# Patient Record
Sex: Female | Born: 1935
Health system: Southern US, Community
[De-identification: ages and names within clinical notes are randomized; demographics above are authoritative.]

---

## 2001-03-09 ENCOUNTER — Ambulatory Visit (HOSPITAL_COMMUNITY): Admission: RE | Admit: 2001-03-09 | Discharge: 2001-03-10 | Payer: Self-pay | Admitting: Ophthalmology

## 2001-03-09 ENCOUNTER — Encounter: Payer: Self-pay | Admitting: Ophthalmology

## 2001-07-13 ENCOUNTER — Encounter: Payer: Self-pay | Admitting: Vascular Surgery

## 2001-07-16 ENCOUNTER — Encounter (INDEPENDENT_AMBULATORY_CARE_PROVIDER_SITE_OTHER): Payer: Self-pay | Admitting: *Deleted

## 2001-07-16 ENCOUNTER — Inpatient Hospital Stay (HOSPITAL_COMMUNITY): Admission: RE | Admit: 2001-07-16 | Discharge: 2001-07-17 | Payer: Self-pay | Admitting: Vascular Surgery

## 2005-01-28 ENCOUNTER — Ambulatory Visit: Payer: Self-pay | Admitting: Cardiology

## 2007-03-08 ENCOUNTER — Ambulatory Visit: Payer: Self-pay | Admitting: Cardiology

## 2009-11-13 ENCOUNTER — Inpatient Hospital Stay (HOSPITAL_COMMUNITY): Admission: RE | Admit: 2009-11-13 | Discharge: 2009-11-16 | Payer: Self-pay | Admitting: Orthopedic Surgery

## 2010-06-21 LAB — COMPREHENSIVE METABOLIC PANEL
AST: 24 U/L (ref 0–37)
BUN: 14 mg/dL (ref 6–23)
CO2: 29 mEq/L (ref 19–32)
Chloride: 104 mEq/L (ref 96–112)
Creatinine, Ser: 1.02 mg/dL (ref 0.4–1.2)
GFR calc Af Amer: >60 mL/min (ref >60)
GFR calc non Af Amer: 52 mL/min — ABNORMAL LOW (ref >60)
Total Bilirubin: 0.7 mg/dL (ref 0.3–1.2)

## 2010-06-21 LAB — CBC
HCT: 36.4 % (ref 36.0–46.0)
Hemoglobin: 12 g/dL (ref 12.0–15.0)
MCH: 28.6 pg (ref 26.0–34.0)
MCHC: 33 g/dL (ref 30.0–36.0)
MCV: 86.7 fL (ref 78.0–100.0)
Platelets: 259 10*3/uL (ref 150–400)
RBC: 4.2 MIL/uL (ref 3.87–5.11)
RDW: 13.6 % (ref 11.5–15.5)
WBC: 5.5 10*3/uL (ref 4.0–10.5)

## 2010-06-21 LAB — DIFFERENTIAL
Basophils Absolute: 0 10*3/uL (ref 0.0–0.1)
Basophils Relative: 1 % (ref 0–1)
Eosinophils Relative: 1 % (ref 0–5)
Lymphocytes Relative: 24 % (ref 12–46)

## 2010-06-21 LAB — URINALYSIS, ROUTINE W REFLEX MICROSCOPIC
Bilirubin Urine: NEGATIVE
Glucose, UA: NEGATIVE mg/dL
Ketones, ur: NEGATIVE mg/dL
Leukocytes, UA: NEGATIVE
Protein, ur: NEGATIVE mg/dL

## 2010-06-21 LAB — PROTIME-INR: Prothrombin Time: 14.2 seconds (ref 11.6–15.2)

## 2010-06-21 LAB — SURGICAL PCR SCREEN: MRSA, PCR: NEGATIVE

## 2010-08-23 NOTE — Op Note (Signed)
[employer]. [facility]  Patient:    Brianna Ford, Brianna Ford Visit Number: [id number] MRN: [id number]          Service Type: SUR Location: RCRM [id number] Attending Physician:  Alyson Locket Dictated by:   Larina Earthly, M.D. Proc. Date: 07/16/01 Admit Date:  07/16/2001   CC:         Dr. Maryan Puls, [address], [place], [place]  [address]   Operative Report  PREOPERATIVE DIAGNOSIS:  Asymptomatic severe right internal carotid artery stenosis.  POSTOPERATIVE DIAGNOSIS:  Asymptomatic severe right internal carotid artery stenosis.  PROCEDURE:  Right carotid endarterectomy with Dacron patch angioplasty.  SURGEON:  Larina Earthly, M.D.  ASSISTANT:  Tollie Pizza. Collins, P.A.-C.  ANESTHESIA:  General endotracheal.  COMPLICATIONS:  None.  DISPOSITION:  To recovery room stable.  PROCEDURE IN DETAIL:  The patient was taken to the operating room, placed in position, where the area of the right neck was prepped and draped in usual sterile fashion.  An incision was made in the anterior sternocleidomastoid and carried down through the platysma with electrocautery.  The sternocleidomastoid was reflected posteriorly and the carotid sheath was opened.  The facial vein was ligated with 2-0 silk ties and divided.  The common carotid artery was encircled with an umbilical tape and Rumel tourniquet.  The vagus nerve was identified and preserved.  Dissection continued on to the bifurcation.  The superior thyroid artery was encircled with a 2-0 silk Potts tie.  The external carotid was encircled with a blue vessel loop and the internal carotid was encircled with an umbilical tape and Rumel tourniquet.  The patient was given 7000 units of intravenous heparin. After adequate circulation time, the internal and external common carotid arteries were occluded.  The common carotid artery was opened with an 11-blade and extended longitudinally with Potts scissors to the plaque on to  the internal carotid artery.  The patient had a high-grade severe stenosis in the internal carotid artery.  A #10 shunt was passed up the internal carotid, allowed to backbleed, and then down the common carotid, where it was secured with Rumel tourniquets.  The endarterectomy was begun on the common carotid artery and the plaque was divided proximally with Potts scissors.  The endarterectomy was continued on to the bifurcation.  The external carotid was endarterectomized with an eversion technique.  The internal carotid was then endarterectomized in an open fashion.  The remaining atheromatous debris was removed from the endarterectomy plane.  Finesse Hemashield Dacron patch was brought onto the field and was sewn as a patch angioplasty with a running 6-0 Prolene suture.  Prior to completion of the anastomosis, the shunt was removed and usual flush maneuvers were undertaken.  The anastomosis was then completed and then the external, followed by the common, finally the internal carotid artery occlusion clamps were removed.  Excellent flow characteristics were noted with hand-held Doppler in the internal and external carotid arteries. The patient was given 50 mg of protamine to reverse the heparin.  The wound was irrigated with saline.  Hemostasis with electrocautery.  Wound was closed with 3-0 Vicryl in the subcutaneous and subcuticular tissue.  Benzoin and Steri-Strips were applied. Dictated by:   Larina Earthly, M.D. Attending Physician:  Alyson Locket DD:  07/16/01 TD:  07/17/01 Job: [id number]

## 2010-08-23 NOTE — H&P (Signed)
[place]. [facility]  Patient:    Brianna Ford, Brianna Ford Visit Number: [id number] MRN: [id number]          Service Type: SUR Location: [id number] Attending Physician:  Alyson Locket Dictated by:   Adair Patter, P.A. Admit Date:  07/16/2001 Discharge Date: 07/17/2001                           History and Physical  CHIEF COMPLAINT:  Carotid artery disease.  HISTORY OF PRESENT ILLNESS:  This is a 77 year old white female who was referred to Dr. Kristen Loader. Early by Dr. Clelia Croft for evaluation of carotid artery disease.  The patient says that during preop evaluation for cataract surgery, her ophthalmologist thought he had a carotid bruit in her neck.  Because of this, she was referred to her medical doctor following her cataract surgery. The patient was found to have a bruit on her right side and was subsequently referred for bilateral carotid Duplex, which revealed she had significant right internal carotid artery stenosis.  The patient denies any headache, nausea, vomiting, vertigo, dizziness, history of falls, seizures, numbness, tingling, muscle weakness, speech impairment or dysphagia.  She denies any visual change, syncope, presyncope, memory loss or confusion.  PAST MEDICAL HISTORY: 1. Hypertension. 2. Cataract.  PAST SURGICAL HISTORY:  Cataract removal from right eye.  ALLERGIES:  The patient denies any known medication allergies.  MEDICATIONS: 1. Lisinopril 20 mg p.o. q.d. 2. Aspirin 81 mg p.o. q.d. 3. Centrum multivitamin one daily.  SOCIAL HISTORY:  Th patient denies any alcohol use or history of smoking.  She is married and has one child.  FAMILY HISTORY:  The patient says that she has 63 sisters and 2 brothers. Several of her family members have hypertension and she had one sister with ovarian cancer.  There is no diabetes mellitus in her family.  She states her father had a cerebrovascular accident and had hypertension.  REVIEW OF SYSTEMS:   GENERAL:  The patient denies any fever, chills, night sweats or frequent illnesses.  HEAD:  The patient denies any head injuries or loss of consciousness.  EYES:  The patient had a cataract in her right eye which was treated surgically.  She wears corrective lenses.  She denies any glaucoma.  EARS:  The patient denies any hearing loss, vertigo, tinnitus or ear infections.  NOSE:  The patient denies any epistaxis, rhinitis or sinusitis.  MOUTH:  The patient denies any problems with dentition or frequent sore throats.  NECK:  The patient denies any lumps, masses or pain on range of motion in her neck.  CARDIOVASCULAR:  The patient has hypertension which is treated medically.  Denies any history of angina, myocardial infarction or cardiac arrhythmias.  PULMONARY:  The patient denies any asthma, bronchitis, emphysema or pneumonia.  GI:  The patient denies any nausea, vomiting, diarrhea, constipation, hematochezia or melena.  ENDOCRINE:  The patient denies any diabetes mellitus or thyroid disease.  GU:  The patient denies any renal disease or urinary tract infections or urinary incontinence. MUSCULOSKELETAL:  The patient denies any arthritis, arthralgias or myalgias. NEUROLOGIC:  The patient denies any memory loss, depression, CVA or TIA.  PHYSICAL EXAMINATION:  VITALS:  Blood pressure is 145/85.  Pulse is 84 and regular.  Respirations are 16.  GENERAL:  The patient is alert and oriented x3, is in no distress.  HEENT:  Head is atraumatic, normocephalic.  Eyes:  Pupils are equal,  round and reactive to light.  Extraocular movements are intact without scleral icterus or nystagmus.  Ears:  Auditory acuity is grossly intact.  Nose:  Nasal patency is intact.  Sinuses are nontender.  Mouth is moist without exudates.  NECK:  Supple without JVD, lymphadenopathy or thyromegaly.  She had a carotid bruit present on the right-hand side.  No bruits are present on the left side.  CARDIOVASCULAR:   Regular rate and rhythm without murmurs, gallops or rubs.  LUNGS:  Bilaterally clear to auscultation without rales rhonchi or wheezes.  ABDOMEN:  Soft, nontender and nondistended.  Positive bowel sounds in all four quadrants.  EXTREMITIES:  No cyanosis, clubbing or edema.  NEUROLOGIC:  Cranial nerves II-XII are grossly intact.  Her gait was steady without the use of assistive devices.  Muscle strength was 5+ and equal in all four extremities.  IMPRESSION:  Right internal carotid artery stenosis.  PLAN:  Dr. Arbie Cookey will perform a right carotid endarterectomy on July 16, 2001. Dictated by:   Adair Patter, P.A. Attending Physician:  Alyson Locket DD:  07/13/01 TD:  07/13/01 Job: [id number]

## 2010-08-23 NOTE — Discharge Summary (Signed)
[place]. [facility]  Patient:    Brianna Ford, Brianna Ford Visit Number: [id number] MRN: [id number]          Service Type: SUR Location: [id number] Attending Physician:  Alyson Locket Dictated by:   Loura Pardon, P.A. Admit Date:  07/16/2001 Disc. Date: 07/17/01   CC:         Kirstie Peri, M.D., [place], [facility] D. Ashley Royalty, M.D.   Discharge Summary  DATE OF BIRTH:  1922-06-24  DISCHARGE DIAGNOSIS:  Asymptomatic high-grade right internal carotid artery stenosis.  SECONDARY DIAGNOSES: 1. Hypertension. 2. History of cataracts.  PROCEDURES:  July 16, 2001, right carotid endarterectomy with Dacron patch angioplasty.  Dr. Tawanna Cooler Early, surgeon.  The patient was transferred in stable and satisfactory condition to the recovery room where she awoke with no neurologic deficits.  Her speech was clear, mental status clear, moving both upper and lower extremities appropriately.  DISCHARGE DISPOSITION:   Ms. Brianna Ford is ready for discharge on postoperative day #1.  She did have some mild hypertension the evening of her surgery.  This was controlled with IV nitroglycerin for a 3-hour period and then weaned.  On the morning of postoperative day #1, she was normotensive. She was afebrile.  She was without oxygen supplementation and had oxygen saturation 99% on room air.  She did have some transient nausea after ambulation.  She was given Phenergan for this and had resolution.  Her postoperative labs looked good.  Hematocrit 31.8%, hemoglobin 10.8. Creatinine 0.6.  She was moving both upper and lower extremities appropriately.  Mental status is clear.  The incision looks good.  There is no evidence of swelling, erythema, or drainage.  She is ambulating independently. Her pain is controlled with Tylox.  DISCHARGE MEDICATIONS: 1. Tylox 1 to 2 tablets p.o. q.4-6h. p.r.n. pain. 2. Enteric-coated aspirin 81 mg daily. 3. Lisinopril 20 mg daily. 4.  Xanax 0.25 mg 1/2 tablet as needed for anxiety.  DISCHARGE ACTIVITY:  She is asked to walk to keep her strength.  She is to avoid heavy lifting or strenuous activity in the three to four days after surgery.  DISCHARGE DIET:  Low-sodium, low-cholesterol diet.  WOUND CARE:  She may shower daily beginning Sunday, April 13.  FOLLOWUP:  She will see Dr. Arbie Cookey on Thursday, May 8, at 8:50 in the morning.  BRIEF HISTORY:  Ms. Brianna Ford is a 77 year old active female found to have a carotid bruit on physical examination.  She underwent carotid duplex ultrasound at [facility].  This study demonstrated a high-grade right internal carotid artery stenosis.  She was referred to Dr. Tawanna Cooler Early for further evaluation.  She underwent repeat carotid duplex ultrasound in the office of [facility] on May 06, 2001. She has a severe, greater than 80% stenosis of the right internal carotid artery at the area of the bifurcation.  The left internal carotid artery is without significant stenosis.  The risks and benefits of surgery for prevention of stroke were discussed with Ms. Goodness, and she wishes to proceed with the surgery.  This was scheduled for July 16, 2001.  HOSPITAL COURSE:  As described in Discharge Disposition.  Brianna Ford was discharged from [facility] on postoperative day #1, April 12. Dictated by:   Loura Pardon, P.A. Attending Physician:  Alyson Locket DD:  07/17/01 TD:  07/18/01 Job: [id number]

## 2010-08-23 NOTE — Op Note (Signed)
[place]. [facility]  Patient:    Brianna Ford, Brianna Ford Visit Number: [id number] MRN: [id number]          Service Type: DSU Location: [phone] Attending Physician:  Bertrum Sol Dictated by: Beulah Gandy. Ashley Royalty, M.D. Proc. Date: 03/09/01 Admit Date: 03/09/2001                             Operative Report  DATE OF BIRTH:  1923/01/26.  PREOPERATIVE DIAGNOSIS:  Retained lens material and retained lens material debris in anterior chamber.  PROCEDURES: 1. Pars plana vitrectomy with retinal photocoagulation. 2. Iridectomy. 3. Anterior chamber wash-out.  SURGEON:  Beulah Gandy. Ashley Royalty, M.D.  ASSISTANT:  Lu Duffel, C.O.A., S.A.  ANESTHESIA:  General.  DESCRIPTION OF PROCEDURE:  Usual prep and drape.  Peritomies at 8, 10, and 2 oclock.  A 4 mm angled infusion port anchored into place at 8 oclock.  The lighted pick and the cutter were placed at 10 and 2 oclock, respectively. Lens material was removed from around the intraocular lens.  Scleral depression was usedto bring the peripheral retina into place and remove the vitreous and lens material from this area.  Once this was accomplished, a peripheral iridectomy was performed at 12 oclock.  Additional lens fragments were seen in the posterior segment of the eye, and they were removed with the wide-field lens, flat lens, and the 30 degree prismatic lens for viewing. Once the entire vitreous cavity was totally cleansed, attention was carried to the anterior chamber, where a 25-gauge needle was inserted through the 11 oclock limbus and debris with lens cortex and some blood was removed from the inferior angle.  The needle was removed, and the wound was leaking; therefore, a 10-0 nylon was used to close the sclerotomy site.  Nylon 9-0 was used to close the other sclerotomy sites.  The instruments were removed from the eye.  The conjunctiva was closed with wet-field cautery.  Polymyxin  and gentamicin were irrigated into Tenons space, atropine solutionwas applied. Decadron 10 mg was injected into the lower subconjunctivalspace.  The indirect ophthalmoscope laser was then moved into place and 421 burns were placed around the retinal periphery in two rows.  The power was 400 milliwatts, 1000 microns each, and 0.1 seconds each.  Conjunctiva was closed with wet-field cautery.  The closing tension was 10 with a Barraquer tonometer.  Polysporin, a patch, and shield were placed.  The patient was awakened and taken to recovery in satisfactory condition. Dictated by:   Beulah Gandy. Ashley Royalty, M.D. Attending Physician: Bertrum Sol DD:  03/09/52 TD:  03/10/01 Job: [id number]

## 2010-11-04 ENCOUNTER — Encounter (INDEPENDENT_AMBULATORY_CARE_PROVIDER_SITE_OTHER): Payer: Self-pay | Admitting: Ophthalmology

## 2010-11-19 ENCOUNTER — Encounter (INDEPENDENT_AMBULATORY_CARE_PROVIDER_SITE_OTHER): Payer: Medicare Other | Admitting: Ophthalmology

## 2010-11-19 DIAGNOSIS — H3530 Unspecified macular degeneration: Secondary | ICD-10-CM

## 2010-11-19 DIAGNOSIS — H35359 Cystoid macular degeneration, unspecified eye: Secondary | ICD-10-CM

## 2010-11-19 DIAGNOSIS — H43819 Vitreous degeneration, unspecified eye: Secondary | ICD-10-CM

## 2010-12-18 ENCOUNTER — Encounter (INDEPENDENT_AMBULATORY_CARE_PROVIDER_SITE_OTHER): Payer: Medicare Other | Admitting: Ophthalmology

## 2010-12-18 DIAGNOSIS — H3530 Unspecified macular degeneration: Secondary | ICD-10-CM

## 2010-12-18 DIAGNOSIS — H35359 Cystoid macular degeneration, unspecified eye: Secondary | ICD-10-CM

## 2010-12-18 DIAGNOSIS — H43819 Vitreous degeneration, unspecified eye: Secondary | ICD-10-CM

## 2011-01-20 ENCOUNTER — Encounter (INDEPENDENT_AMBULATORY_CARE_PROVIDER_SITE_OTHER): Payer: Medicare Other | Admitting: Ophthalmology

## 2011-01-20 DIAGNOSIS — H35359 Cystoid macular degeneration, unspecified eye: Secondary | ICD-10-CM

## 2011-01-20 DIAGNOSIS — H43819 Vitreous degeneration, unspecified eye: Secondary | ICD-10-CM

## 2011-01-20 DIAGNOSIS — H353190 Nonexudative age-related macular degeneration, unspecified eye, stage unspecified: Secondary | ICD-10-CM

## 2011-02-02 IMAGING — RF DG ANKLE COMPLETE 3+V*L*
1 series · 3 of 3 positions shown · non-contrast
Comparison: None.

CLINICAL DATA: ORIF of the left ankle for bimalleolar fracture.

LEFT ANKLE COMPLETE - 3+ VIEW

[Series 1: run · 3 of 3 slices shown]
[im 1/3]
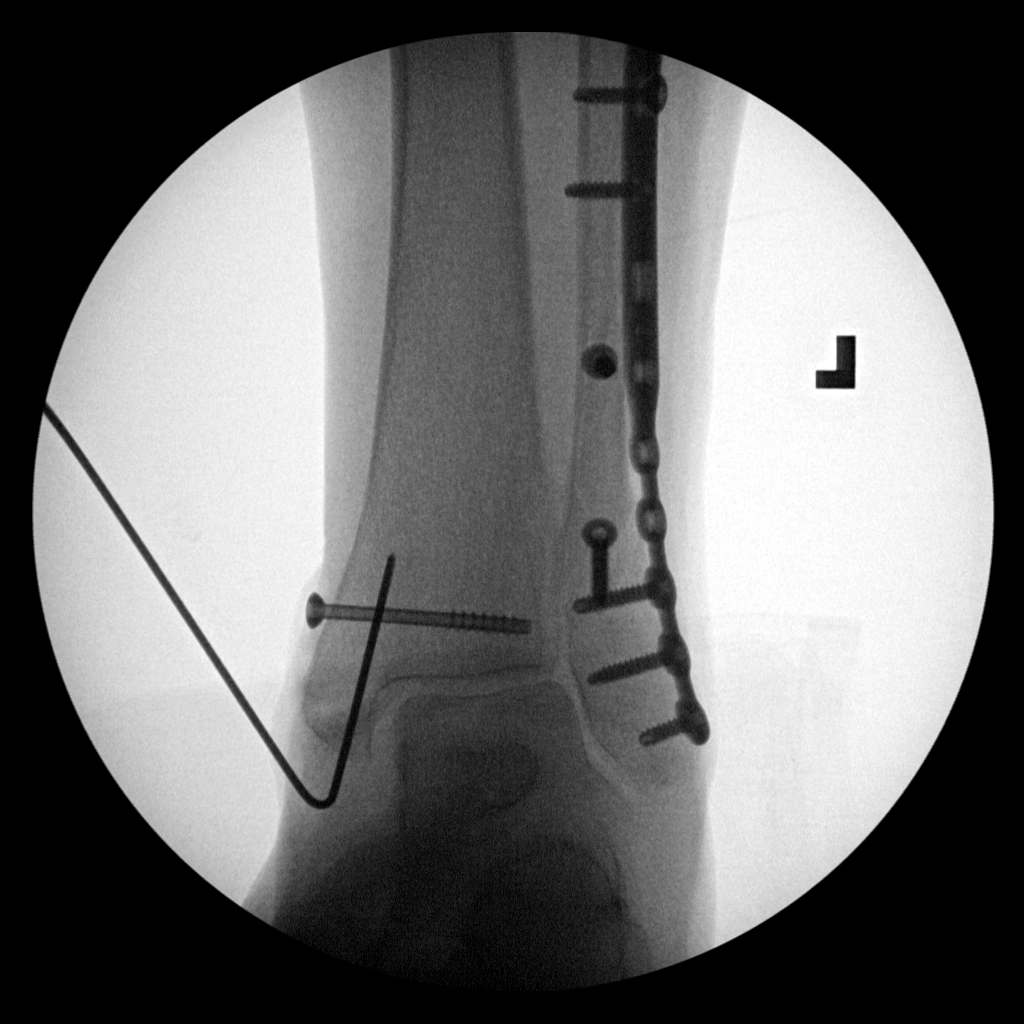
[im 2/3]
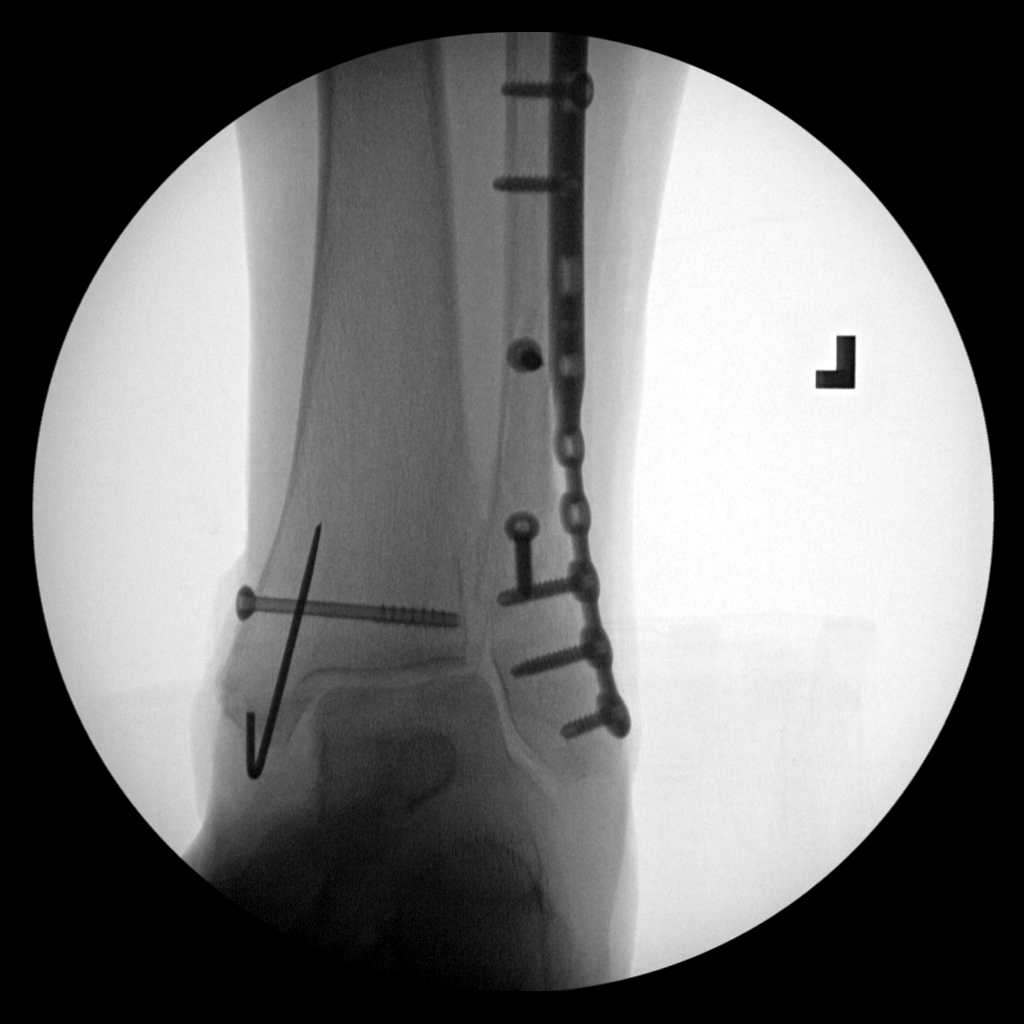
[im 3/3]
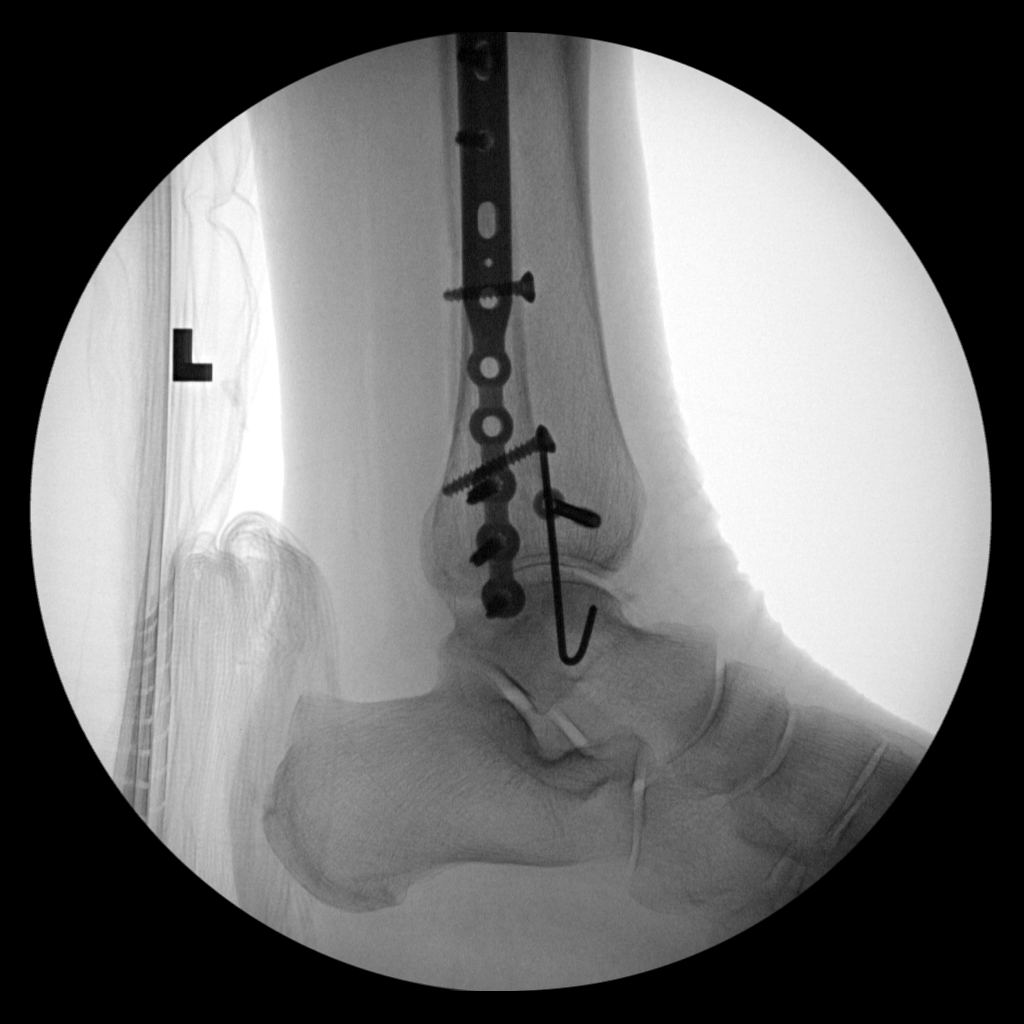

[3 of 3 positions shown; findings below may reference images not displayed]

FINDINGS: Three spot intraoperative fluoroscopic views of the left
ankle are submitted.  There are postoperative changes of ORIF of
the left ankle, with lateral cortical plate and screw fixation of
the distal fibula.  There is a single transversely oriented screw
in the distal tibia and [DATE] is seen in the medial malleolus.
Ankle mortise is aligned.  Fracture fragments.  Near anatomic
alignment.
IMPRESSION: ORIF for bimalleolar fracture.  No complicating feature identified.

## 2011-02-24 ENCOUNTER — Encounter (INDEPENDENT_AMBULATORY_CARE_PROVIDER_SITE_OTHER): Payer: Medicare Other | Admitting: Ophthalmology

## 2011-03-10 ENCOUNTER — Encounter (INDEPENDENT_AMBULATORY_CARE_PROVIDER_SITE_OTHER): Payer: Medicare Other | Admitting: Ophthalmology

## 2011-03-12 ENCOUNTER — Encounter (INDEPENDENT_AMBULATORY_CARE_PROVIDER_SITE_OTHER): Payer: Medicare Other | Admitting: Ophthalmology

## 2011-03-12 DIAGNOSIS — H43819 Vitreous degeneration, unspecified eye: Secondary | ICD-10-CM

## 2011-03-12 DIAGNOSIS — H35359 Cystoid macular degeneration, unspecified eye: Secondary | ICD-10-CM

## 2011-03-12 DIAGNOSIS — H3530 Unspecified macular degeneration: Secondary | ICD-10-CM

## 2011-04-16 ENCOUNTER — Encounter (INDEPENDENT_AMBULATORY_CARE_PROVIDER_SITE_OTHER): Payer: Medicare Other | Admitting: Ophthalmology

## 2012-05-31 ENCOUNTER — Encounter (INDEPENDENT_AMBULATORY_CARE_PROVIDER_SITE_OTHER): Payer: Medicare Other | Admitting: Ophthalmology

## 2012-05-31 DIAGNOSIS — H43819 Vitreous degeneration, unspecified eye: Secondary | ICD-10-CM

## 2012-05-31 DIAGNOSIS — H35359 Cystoid macular degeneration, unspecified eye: Secondary | ICD-10-CM

## 2012-05-31 DIAGNOSIS — H3530 Unspecified macular degeneration: Secondary | ICD-10-CM

## 2012-06-28 ENCOUNTER — Encounter (INDEPENDENT_AMBULATORY_CARE_PROVIDER_SITE_OTHER): Payer: Medicare Other | Admitting: Ophthalmology

## 2012-07-28 ENCOUNTER — Encounter (INDEPENDENT_AMBULATORY_CARE_PROVIDER_SITE_OTHER): Payer: Medicare Other | Admitting: Ophthalmology

## 2012-07-28 DIAGNOSIS — H35359 Cystoid macular degeneration, unspecified eye: Secondary | ICD-10-CM

## 2012-07-28 DIAGNOSIS — H3530 Unspecified macular degeneration: Secondary | ICD-10-CM

## 2012-07-28 DIAGNOSIS — H43819 Vitreous degeneration, unspecified eye: Secondary | ICD-10-CM

## 2012-08-25 ENCOUNTER — Encounter (INDEPENDENT_AMBULATORY_CARE_PROVIDER_SITE_OTHER): Payer: Medicare Other | Admitting: Ophthalmology

## 2012-09-13 ENCOUNTER — Encounter (INDEPENDENT_AMBULATORY_CARE_PROVIDER_SITE_OTHER): Payer: Medicare Other | Admitting: Ophthalmology

## 2015-01-03 ENCOUNTER — Encounter (INDEPENDENT_AMBULATORY_CARE_PROVIDER_SITE_OTHER): Payer: Medicare Other | Admitting: Ophthalmology

## 2015-01-11 ENCOUNTER — Encounter (INDEPENDENT_AMBULATORY_CARE_PROVIDER_SITE_OTHER): Payer: Medicare Other | Admitting: Ophthalmology

## 2015-01-16 ENCOUNTER — Encounter (INDEPENDENT_AMBULATORY_CARE_PROVIDER_SITE_OTHER): Payer: Medicare Other | Admitting: Ophthalmology

## 2015-01-17 ENCOUNTER — Encounter (INDEPENDENT_AMBULATORY_CARE_PROVIDER_SITE_OTHER): Payer: Medicare Other | Admitting: Ophthalmology

## 2015-01-17 DIAGNOSIS — H353124 Nonexudative age-related macular degeneration, left eye, advanced atrophic with subfoveal involvement: Secondary | ICD-10-CM | POA: Diagnosis not present

## 2015-01-17 DIAGNOSIS — I10 Essential (primary) hypertension: Secondary | ICD-10-CM | POA: Diagnosis not present

## 2015-01-17 DIAGNOSIS — H59031 Cystoid macular edema following cataract surgery, right eye: Secondary | ICD-10-CM | POA: Diagnosis not present

## 2015-01-17 DIAGNOSIS — H353211 Exudative age-related macular degeneration, right eye, with active choroidal neovascularization: Secondary | ICD-10-CM | POA: Diagnosis not present

## 2015-01-17 DIAGNOSIS — H35033 Hypertensive retinopathy, bilateral: Secondary | ICD-10-CM | POA: Diagnosis not present

## 2015-01-17 DIAGNOSIS — H43813 Vitreous degeneration, bilateral: Secondary | ICD-10-CM

## 2015-04-18 DIAGNOSIS — Z418 Encounter for other procedures for purposes other than remedying health state: Secondary | ICD-10-CM | POA: Diagnosis not present

## 2015-04-18 DIAGNOSIS — J069 Acute upper respiratory infection, unspecified: Secondary | ICD-10-CM | POA: Diagnosis not present

## 2015-04-18 DIAGNOSIS — Z789 Other specified health status: Secondary | ICD-10-CM | POA: Diagnosis not present

## 2015-04-18 DIAGNOSIS — M545 Low back pain: Secondary | ICD-10-CM | POA: Diagnosis not present

## 2015-04-18 DIAGNOSIS — G8929 Other chronic pain: Secondary | ICD-10-CM | POA: Diagnosis not present

## 2015-04-18 DIAGNOSIS — Z6826 Body mass index (BMI) 26.0-26.9, adult: Secondary | ICD-10-CM | POA: Diagnosis not present

## 2015-04-27 DIAGNOSIS — H547 Unspecified visual loss: Secondary | ICD-10-CM | POA: Diagnosis not present

## 2015-04-27 DIAGNOSIS — H353122 Nonexudative age-related macular degeneration, left eye, intermediate dry stage: Secondary | ICD-10-CM | POA: Diagnosis not present

## 2015-04-27 DIAGNOSIS — H353213 Exudative age-related macular degeneration, right eye, with inactive scar: Secondary | ICD-10-CM | POA: Diagnosis not present

## 2015-06-06 DIAGNOSIS — M545 Low back pain: Secondary | ICD-10-CM | POA: Diagnosis not present

## 2015-06-06 DIAGNOSIS — G8929 Other chronic pain: Secondary | ICD-10-CM | POA: Diagnosis not present

## 2015-06-06 DIAGNOSIS — F329 Major depressive disorder, single episode, unspecified: Secondary | ICD-10-CM | POA: Diagnosis not present

## 2015-06-06 DIAGNOSIS — I10 Essential (primary) hypertension: Secondary | ICD-10-CM | POA: Diagnosis not present

## 2015-07-10 DIAGNOSIS — H01009 Unspecified blepharitis unspecified eye, unspecified eyelid: Secondary | ICD-10-CM | POA: Diagnosis not present

## 2015-07-10 DIAGNOSIS — H02059 Trichiasis without entropian unspecified eye, unspecified eyelid: Secondary | ICD-10-CM | POA: Diagnosis not present

## 2015-07-25 DIAGNOSIS — M19019 Primary osteoarthritis, unspecified shoulder: Secondary | ICD-10-CM | POA: Diagnosis not present

## 2015-07-25 DIAGNOSIS — I7300 Raynaud's syndrome without gangrene: Secondary | ICD-10-CM | POA: Diagnosis not present

## 2015-07-25 DIAGNOSIS — K219 Gastro-esophageal reflux disease without esophagitis: Secondary | ICD-10-CM | POA: Diagnosis not present

## 2015-07-25 DIAGNOSIS — F329 Major depressive disorder, single episode, unspecified: Secondary | ICD-10-CM | POA: Diagnosis not present

## 2015-08-22 DIAGNOSIS — M545 Low back pain: Secondary | ICD-10-CM | POA: Diagnosis not present

## 2015-08-22 DIAGNOSIS — M47816 Spondylosis without myelopathy or radiculopathy, lumbar region: Secondary | ICD-10-CM | POA: Diagnosis not present

## 2015-08-22 DIAGNOSIS — F329 Major depressive disorder, single episode, unspecified: Secondary | ICD-10-CM | POA: Diagnosis not present

## 2015-08-22 DIAGNOSIS — M549 Dorsalgia, unspecified: Secondary | ICD-10-CM | POA: Diagnosis not present

## 2015-08-22 DIAGNOSIS — R079 Chest pain, unspecified: Secondary | ICD-10-CM | POA: Diagnosis not present

## 2015-08-22 DIAGNOSIS — Z1389 Encounter for screening for other disorder: Secondary | ICD-10-CM | POA: Diagnosis not present

## 2015-09-18 DIAGNOSIS — F419 Anxiety disorder, unspecified: Secondary | ICD-10-CM | POA: Diagnosis not present

## 2015-09-18 DIAGNOSIS — R351 Nocturia: Secondary | ICD-10-CM | POA: Diagnosis not present

## 2015-10-08 DIAGNOSIS — H353122 Nonexudative age-related macular degeneration, left eye, intermediate dry stage: Secondary | ICD-10-CM | POA: Diagnosis not present

## 2015-10-08 DIAGNOSIS — Z961 Presence of intraocular lens: Secondary | ICD-10-CM | POA: Diagnosis not present

## 2015-10-08 DIAGNOSIS — H00029 Hordeolum internum unspecified eye, unspecified eyelid: Secondary | ICD-10-CM | POA: Diagnosis not present

## 2015-10-08 DIAGNOSIS — H02051 Trichiasis without entropian right upper eyelid: Secondary | ICD-10-CM | POA: Diagnosis not present

## 2015-10-11 DIAGNOSIS — M159 Polyosteoarthritis, unspecified: Secondary | ICD-10-CM | POA: Diagnosis not present

## 2015-10-11 DIAGNOSIS — I10 Essential (primary) hypertension: Secondary | ICD-10-CM | POA: Diagnosis not present

## 2015-11-13 DIAGNOSIS — M159 Polyosteoarthritis, unspecified: Secondary | ICD-10-CM | POA: Diagnosis not present

## 2015-11-13 DIAGNOSIS — I10 Essential (primary) hypertension: Secondary | ICD-10-CM | POA: Diagnosis not present

## 2015-12-12 DIAGNOSIS — Z1389 Encounter for screening for other disorder: Secondary | ICD-10-CM | POA: Diagnosis not present

## 2015-12-12 DIAGNOSIS — Z0000 Encounter for general adult medical examination without abnormal findings: Secondary | ICD-10-CM | POA: Diagnosis not present

## 2015-12-12 DIAGNOSIS — Z1211 Encounter for screening for malignant neoplasm of colon: Secondary | ICD-10-CM | POA: Diagnosis not present

## 2015-12-12 DIAGNOSIS — Z7189 Other specified counseling: Secondary | ICD-10-CM | POA: Diagnosis not present

## 2015-12-12 DIAGNOSIS — I10 Essential (primary) hypertension: Secondary | ICD-10-CM | POA: Diagnosis not present

## 2015-12-12 DIAGNOSIS — Z299 Encounter for prophylactic measures, unspecified: Secondary | ICD-10-CM | POA: Diagnosis not present

## 2015-12-12 DIAGNOSIS — R5383 Other fatigue: Secondary | ICD-10-CM | POA: Diagnosis not present

## 2015-12-12 DIAGNOSIS — Z79899 Other long term (current) drug therapy: Secondary | ICD-10-CM | POA: Diagnosis not present

## 2015-12-19 DIAGNOSIS — Z23 Encounter for immunization: Secondary | ICD-10-CM | POA: Diagnosis not present

## 2015-12-21 DIAGNOSIS — I10 Essential (primary) hypertension: Secondary | ICD-10-CM | POA: Diagnosis not present

## 2015-12-21 DIAGNOSIS — M159 Polyosteoarthritis, unspecified: Secondary | ICD-10-CM | POA: Diagnosis not present

## 2016-01-11 DIAGNOSIS — I10 Essential (primary) hypertension: Secondary | ICD-10-CM | POA: Diagnosis not present

## 2016-01-11 DIAGNOSIS — G4700 Insomnia, unspecified: Secondary | ICD-10-CM | POA: Diagnosis not present

## 2016-01-11 DIAGNOSIS — Z6828 Body mass index (BMI) 28.0-28.9, adult: Secondary | ICD-10-CM | POA: Diagnosis not present

## 2016-01-11 DIAGNOSIS — E039 Hypothyroidism, unspecified: Secondary | ICD-10-CM | POA: Diagnosis not present

## 2016-01-11 DIAGNOSIS — Z299 Encounter for prophylactic measures, unspecified: Secondary | ICD-10-CM | POA: Diagnosis not present

## 2016-01-18 ENCOUNTER — Ambulatory Visit (INDEPENDENT_AMBULATORY_CARE_PROVIDER_SITE_OTHER): Payer: Medicare Other | Admitting: Ophthalmology

## 2016-01-18 DIAGNOSIS — I10 Essential (primary) hypertension: Secondary | ICD-10-CM

## 2016-01-18 DIAGNOSIS — H353132 Nonexudative age-related macular degeneration, bilateral, intermediate dry stage: Secondary | ICD-10-CM

## 2016-01-18 DIAGNOSIS — H59031 Cystoid macular edema following cataract surgery, right eye: Secondary | ICD-10-CM | POA: Diagnosis not present

## 2016-01-18 DIAGNOSIS — H35033 Hypertensive retinopathy, bilateral: Secondary | ICD-10-CM

## 2016-01-18 DIAGNOSIS — H43813 Vitreous degeneration, bilateral: Secondary | ICD-10-CM

## 2016-01-29 DIAGNOSIS — I10 Essential (primary) hypertension: Secondary | ICD-10-CM | POA: Diagnosis not present

## 2016-01-29 DIAGNOSIS — M159 Polyosteoarthritis, unspecified: Secondary | ICD-10-CM | POA: Diagnosis not present

## 2016-03-26 DIAGNOSIS — S0083XA Contusion of other part of head, initial encounter: Secondary | ICD-10-CM | POA: Diagnosis not present

## 2016-03-26 DIAGNOSIS — S199XXA Unspecified injury of neck, initial encounter: Secondary | ICD-10-CM | POA: Diagnosis not present

## 2016-03-26 DIAGNOSIS — W1789XA Other fall from one level to another, initial encounter: Secondary | ICD-10-CM | POA: Diagnosis not present

## 2016-03-26 DIAGNOSIS — S0080XA Unspecified superficial injury of other part of head, initial encounter: Secondary | ICD-10-CM | POA: Diagnosis not present

## 2016-03-26 DIAGNOSIS — I10 Essential (primary) hypertension: Secondary | ICD-10-CM | POA: Diagnosis not present

## 2016-03-26 DIAGNOSIS — S0003XA Contusion of scalp, initial encounter: Secondary | ICD-10-CM | POA: Diagnosis not present

## 2016-03-26 DIAGNOSIS — S0093XA Contusion of unspecified part of head, initial encounter: Secondary | ICD-10-CM | POA: Diagnosis not present

## 2016-04-02 DIAGNOSIS — M159 Polyosteoarthritis, unspecified: Secondary | ICD-10-CM | POA: Diagnosis not present

## 2016-04-02 DIAGNOSIS — I10 Essential (primary) hypertension: Secondary | ICD-10-CM | POA: Diagnosis not present

## 2016-04-15 DIAGNOSIS — H59031 Cystoid macular edema following cataract surgery, right eye: Secondary | ICD-10-CM | POA: Diagnosis not present

## 2016-04-15 DIAGNOSIS — H353112 Nonexudative age-related macular degeneration, right eye, intermediate dry stage: Secondary | ICD-10-CM | POA: Diagnosis not present

## 2016-04-15 DIAGNOSIS — H353122 Nonexudative age-related macular degeneration, left eye, intermediate dry stage: Secondary | ICD-10-CM | POA: Diagnosis not present

## 2016-04-15 DIAGNOSIS — H547 Unspecified visual loss: Secondary | ICD-10-CM | POA: Diagnosis not present

## 2016-05-07 DIAGNOSIS — K219 Gastro-esophageal reflux disease without esophagitis: Secondary | ICD-10-CM | POA: Diagnosis not present

## 2016-05-07 DIAGNOSIS — G4700 Insomnia, unspecified: Secondary | ICD-10-CM | POA: Diagnosis not present

## 2016-05-07 DIAGNOSIS — N390 Urinary tract infection, site not specified: Secondary | ICD-10-CM | POA: Diagnosis not present

## 2016-05-07 DIAGNOSIS — I340 Nonrheumatic mitral (valve) insufficiency: Secondary | ICD-10-CM | POA: Diagnosis not present

## 2016-05-07 DIAGNOSIS — N183 Chronic kidney disease, stage 3 (moderate): Secondary | ICD-10-CM | POA: Diagnosis not present

## 2016-05-07 DIAGNOSIS — Z713 Dietary counseling and surveillance: Secondary | ICD-10-CM | POA: Diagnosis not present

## 2016-05-07 DIAGNOSIS — R350 Frequency of micturition: Secondary | ICD-10-CM | POA: Diagnosis not present

## 2016-05-07 DIAGNOSIS — Z789 Other specified health status: Secondary | ICD-10-CM | POA: Diagnosis not present

## 2016-05-07 DIAGNOSIS — F329 Major depressive disorder, single episode, unspecified: Secondary | ICD-10-CM | POA: Diagnosis not present

## 2016-05-07 DIAGNOSIS — Z6827 Body mass index (BMI) 27.0-27.9, adult: Secondary | ICD-10-CM | POA: Diagnosis not present

## 2016-05-07 DIAGNOSIS — Z299 Encounter for prophylactic measures, unspecified: Secondary | ICD-10-CM | POA: Diagnosis not present

## 2016-05-12 DIAGNOSIS — I340 Nonrheumatic mitral (valve) insufficiency: Secondary | ICD-10-CM | POA: Diagnosis not present

## 2016-06-17 DIAGNOSIS — I10 Essential (primary) hypertension: Secondary | ICD-10-CM | POA: Diagnosis not present

## 2016-06-17 DIAGNOSIS — M159 Polyosteoarthritis, unspecified: Secondary | ICD-10-CM | POA: Diagnosis not present

## 2016-07-07 DIAGNOSIS — G4700 Insomnia, unspecified: Secondary | ICD-10-CM | POA: Diagnosis not present

## 2016-07-07 DIAGNOSIS — E039 Hypothyroidism, unspecified: Secondary | ICD-10-CM | POA: Diagnosis not present

## 2016-07-07 DIAGNOSIS — Z1389 Encounter for screening for other disorder: Secondary | ICD-10-CM | POA: Diagnosis not present

## 2016-07-07 DIAGNOSIS — I10 Essential (primary) hypertension: Secondary | ICD-10-CM | POA: Diagnosis not present

## 2016-07-07 DIAGNOSIS — M549 Dorsalgia, unspecified: Secondary | ICD-10-CM | POA: Diagnosis not present

## 2016-07-07 DIAGNOSIS — F329 Major depressive disorder, single episode, unspecified: Secondary | ICD-10-CM | POA: Diagnosis not present

## 2016-07-07 DIAGNOSIS — Z713 Dietary counseling and surveillance: Secondary | ICD-10-CM | POA: Diagnosis not present

## 2016-07-07 DIAGNOSIS — Z6828 Body mass index (BMI) 28.0-28.9, adult: Secondary | ICD-10-CM | POA: Diagnosis not present

## 2016-07-07 DIAGNOSIS — Z299 Encounter for prophylactic measures, unspecified: Secondary | ICD-10-CM | POA: Diagnosis not present

## 2016-09-18 DIAGNOSIS — N183 Chronic kidney disease, stage 3 (moderate): Secondary | ICD-10-CM | POA: Diagnosis not present

## 2016-09-18 DIAGNOSIS — F419 Anxiety disorder, unspecified: Secondary | ICD-10-CM | POA: Diagnosis not present

## 2016-09-18 DIAGNOSIS — Z6827 Body mass index (BMI) 27.0-27.9, adult: Secondary | ICD-10-CM | POA: Diagnosis not present

## 2016-09-18 DIAGNOSIS — F329 Major depressive disorder, single episode, unspecified: Secondary | ICD-10-CM | POA: Diagnosis not present

## 2016-09-18 DIAGNOSIS — E039 Hypothyroidism, unspecified: Secondary | ICD-10-CM | POA: Diagnosis not present

## 2016-09-18 DIAGNOSIS — Z299 Encounter for prophylactic measures, unspecified: Secondary | ICD-10-CM | POA: Diagnosis not present

## 2016-09-18 DIAGNOSIS — I7300 Raynaud's syndrome without gangrene: Secondary | ICD-10-CM | POA: Diagnosis not present

## 2016-09-18 DIAGNOSIS — K219 Gastro-esophageal reflux disease without esophagitis: Secondary | ICD-10-CM | POA: Diagnosis not present

## 2016-09-18 DIAGNOSIS — I6529 Occlusion and stenosis of unspecified carotid artery: Secondary | ICD-10-CM | POA: Diagnosis not present

## 2016-09-18 DIAGNOSIS — I10 Essential (primary) hypertension: Secondary | ICD-10-CM | POA: Diagnosis not present

## 2016-09-18 DIAGNOSIS — I340 Nonrheumatic mitral (valve) insufficiency: Secondary | ICD-10-CM | POA: Diagnosis not present

## 2016-09-18 DIAGNOSIS — N393 Stress incontinence (female) (male): Secondary | ICD-10-CM | POA: Diagnosis not present

## 2016-11-13 DIAGNOSIS — I10 Essential (primary) hypertension: Secondary | ICD-10-CM | POA: Diagnosis not present

## 2016-11-13 DIAGNOSIS — E039 Hypothyroidism, unspecified: Secondary | ICD-10-CM | POA: Diagnosis not present

## 2016-11-13 DIAGNOSIS — Z6827 Body mass index (BMI) 27.0-27.9, adult: Secondary | ICD-10-CM | POA: Diagnosis not present

## 2016-11-13 DIAGNOSIS — R0789 Other chest pain: Secondary | ICD-10-CM | POA: Diagnosis not present

## 2016-11-13 DIAGNOSIS — Z713 Dietary counseling and surveillance: Secondary | ICD-10-CM | POA: Diagnosis not present

## 2016-11-13 DIAGNOSIS — N183 Chronic kidney disease, stage 3 (moderate): Secondary | ICD-10-CM | POA: Diagnosis not present

## 2016-11-13 DIAGNOSIS — Z299 Encounter for prophylactic measures, unspecified: Secondary | ICD-10-CM | POA: Diagnosis not present

## 2016-11-13 DIAGNOSIS — F329 Major depressive disorder, single episode, unspecified: Secondary | ICD-10-CM | POA: Diagnosis not present

## 2016-12-12 DIAGNOSIS — Z23 Encounter for immunization: Secondary | ICD-10-CM | POA: Diagnosis not present

## 2016-12-16 DIAGNOSIS — Z299 Encounter for prophylactic measures, unspecified: Secondary | ICD-10-CM | POA: Diagnosis not present

## 2016-12-16 DIAGNOSIS — N390 Urinary tract infection, site not specified: Secondary | ICD-10-CM | POA: Diagnosis not present

## 2016-12-16 DIAGNOSIS — Z0000 Encounter for general adult medical examination without abnormal findings: Secondary | ICD-10-CM | POA: Diagnosis not present

## 2016-12-16 DIAGNOSIS — R350 Frequency of micturition: Secondary | ICD-10-CM | POA: Diagnosis not present

## 2016-12-16 DIAGNOSIS — I10 Essential (primary) hypertension: Secondary | ICD-10-CM | POA: Diagnosis not present

## 2016-12-16 DIAGNOSIS — Z1211 Encounter for screening for malignant neoplasm of colon: Secondary | ICD-10-CM | POA: Diagnosis not present

## 2016-12-16 DIAGNOSIS — Z1389 Encounter for screening for other disorder: Secondary | ICD-10-CM | POA: Diagnosis not present

## 2016-12-16 DIAGNOSIS — Z7189 Other specified counseling: Secondary | ICD-10-CM | POA: Diagnosis not present

## 2016-12-16 DIAGNOSIS — R5383 Other fatigue: Secondary | ICD-10-CM | POA: Diagnosis not present

## 2016-12-16 DIAGNOSIS — N183 Chronic kidney disease, stage 3 (moderate): Secondary | ICD-10-CM | POA: Diagnosis not present

## 2016-12-16 DIAGNOSIS — Z6827 Body mass index (BMI) 27.0-27.9, adult: Secondary | ICD-10-CM | POA: Diagnosis not present

## 2016-12-16 DIAGNOSIS — E039 Hypothyroidism, unspecified: Secondary | ICD-10-CM | POA: Diagnosis not present

## 2016-12-23 DIAGNOSIS — R5383 Other fatigue: Secondary | ICD-10-CM | POA: Diagnosis not present

## 2016-12-23 DIAGNOSIS — Z79899 Other long term (current) drug therapy: Secondary | ICD-10-CM | POA: Diagnosis not present

## 2016-12-23 DIAGNOSIS — E559 Vitamin D deficiency, unspecified: Secondary | ICD-10-CM | POA: Diagnosis not present

## 2016-12-23 DIAGNOSIS — E039 Hypothyroidism, unspecified: Secondary | ICD-10-CM | POA: Diagnosis not present

## 2016-12-23 DIAGNOSIS — I10 Essential (primary) hypertension: Secondary | ICD-10-CM | POA: Diagnosis not present

## 2016-12-26 DIAGNOSIS — H353122 Nonexudative age-related macular degeneration, left eye, intermediate dry stage: Secondary | ICD-10-CM | POA: Diagnosis not present

## 2016-12-26 DIAGNOSIS — H353112 Nonexudative age-related macular degeneration, right eye, intermediate dry stage: Secondary | ICD-10-CM | POA: Diagnosis not present

## 2016-12-26 DIAGNOSIS — H547 Unspecified visual loss: Secondary | ICD-10-CM | POA: Diagnosis not present

## 2016-12-26 DIAGNOSIS — H59031 Cystoid macular edema following cataract surgery, right eye: Secondary | ICD-10-CM | POA: Diagnosis not present

## 2017-01-21 ENCOUNTER — Ambulatory Visit (INDEPENDENT_AMBULATORY_CARE_PROVIDER_SITE_OTHER): Payer: Medicare Other | Admitting: Ophthalmology

## 2017-01-27 DIAGNOSIS — M159 Polyosteoarthritis, unspecified: Secondary | ICD-10-CM | POA: Diagnosis not present

## 2017-01-27 DIAGNOSIS — I10 Essential (primary) hypertension: Secondary | ICD-10-CM | POA: Diagnosis not present

## 2017-03-23 DIAGNOSIS — N183 Chronic kidney disease, stage 3 (moderate): Secondary | ICD-10-CM | POA: Diagnosis not present

## 2017-03-23 DIAGNOSIS — I6529 Occlusion and stenosis of unspecified carotid artery: Secondary | ICD-10-CM | POA: Diagnosis not present

## 2017-03-23 DIAGNOSIS — Z6827 Body mass index (BMI) 27.0-27.9, adult: Secondary | ICD-10-CM | POA: Diagnosis not present

## 2017-03-23 DIAGNOSIS — Z299 Encounter for prophylactic measures, unspecified: Secondary | ICD-10-CM | POA: Diagnosis not present

## 2017-03-23 DIAGNOSIS — E039 Hypothyroidism, unspecified: Secondary | ICD-10-CM | POA: Diagnosis not present

## 2017-03-23 DIAGNOSIS — I10 Essential (primary) hypertension: Secondary | ICD-10-CM | POA: Diagnosis not present

## 2017-03-23 DIAGNOSIS — F329 Major depressive disorder, single episode, unspecified: Secondary | ICD-10-CM | POA: Diagnosis not present

## 2017-06-12 DIAGNOSIS — I10 Essential (primary) hypertension: Secondary | ICD-10-CM | POA: Diagnosis not present

## 2017-06-12 DIAGNOSIS — M159 Polyosteoarthritis, unspecified: Secondary | ICD-10-CM | POA: Diagnosis not present

## 2017-06-15 DIAGNOSIS — M79672 Pain in left foot: Secondary | ICD-10-CM | POA: Diagnosis not present

## 2017-06-15 DIAGNOSIS — I739 Peripheral vascular disease, unspecified: Secondary | ICD-10-CM | POA: Diagnosis not present

## 2017-06-15 DIAGNOSIS — M79671 Pain in right foot: Secondary | ICD-10-CM | POA: Diagnosis not present

## 2017-06-15 DIAGNOSIS — L600 Ingrowing nail: Secondary | ICD-10-CM | POA: Diagnosis not present

## 2017-06-23 DIAGNOSIS — Z299 Encounter for prophylactic measures, unspecified: Secondary | ICD-10-CM | POA: Diagnosis not present

## 2017-06-23 DIAGNOSIS — I10 Essential (primary) hypertension: Secondary | ICD-10-CM | POA: Diagnosis not present

## 2017-06-23 DIAGNOSIS — N183 Chronic kidney disease, stage 3 (moderate): Secondary | ICD-10-CM | POA: Diagnosis not present

## 2017-06-23 DIAGNOSIS — K219 Gastro-esophageal reflux disease without esophagitis: Secondary | ICD-10-CM | POA: Diagnosis not present

## 2017-06-23 DIAGNOSIS — Z789 Other specified health status: Secondary | ICD-10-CM | POA: Diagnosis not present

## 2017-06-23 DIAGNOSIS — Z6827 Body mass index (BMI) 27.0-27.9, adult: Secondary | ICD-10-CM | POA: Diagnosis not present

## 2017-07-30 DIAGNOSIS — I10 Essential (primary) hypertension: Secondary | ICD-10-CM | POA: Diagnosis not present

## 2017-07-30 DIAGNOSIS — M159 Polyosteoarthritis, unspecified: Secondary | ICD-10-CM | POA: Diagnosis not present

## 2017-09-01 DIAGNOSIS — M79671 Pain in right foot: Secondary | ICD-10-CM | POA: Diagnosis not present

## 2017-09-01 DIAGNOSIS — I739 Peripheral vascular disease, unspecified: Secondary | ICD-10-CM | POA: Diagnosis not present

## 2017-09-01 DIAGNOSIS — M79672 Pain in left foot: Secondary | ICD-10-CM | POA: Diagnosis not present

## 2017-09-01 DIAGNOSIS — L600 Ingrowing nail: Secondary | ICD-10-CM | POA: Diagnosis not present

## 2017-09-23 DIAGNOSIS — I10 Essential (primary) hypertension: Secondary | ICD-10-CM | POA: Diagnosis not present

## 2017-09-23 DIAGNOSIS — Z299 Encounter for prophylactic measures, unspecified: Secondary | ICD-10-CM | POA: Diagnosis not present

## 2017-09-23 DIAGNOSIS — N183 Chronic kidney disease, stage 3 (moderate): Secondary | ICD-10-CM | POA: Diagnosis not present

## 2017-09-23 DIAGNOSIS — E039 Hypothyroidism, unspecified: Secondary | ICD-10-CM | POA: Diagnosis not present

## 2017-09-23 DIAGNOSIS — Z6827 Body mass index (BMI) 27.0-27.9, adult: Secondary | ICD-10-CM | POA: Diagnosis not present

## 2017-09-23 DIAGNOSIS — F419 Anxiety disorder, unspecified: Secondary | ICD-10-CM | POA: Diagnosis not present

## 2017-12-01 DIAGNOSIS — M159 Polyosteoarthritis, unspecified: Secondary | ICD-10-CM | POA: Diagnosis not present

## 2017-12-01 DIAGNOSIS — I10 Essential (primary) hypertension: Secondary | ICD-10-CM | POA: Diagnosis not present

## 2017-12-25 DIAGNOSIS — Z23 Encounter for immunization: Secondary | ICD-10-CM | POA: Diagnosis not present

## 2017-12-29 DIAGNOSIS — Z6827 Body mass index (BMI) 27.0-27.9, adult: Secondary | ICD-10-CM | POA: Diagnosis not present

## 2017-12-29 DIAGNOSIS — E039 Hypothyroidism, unspecified: Secondary | ICD-10-CM | POA: Diagnosis not present

## 2017-12-29 DIAGNOSIS — Z7189 Other specified counseling: Secondary | ICD-10-CM | POA: Diagnosis not present

## 2017-12-29 DIAGNOSIS — I6529 Occlusion and stenosis of unspecified carotid artery: Secondary | ICD-10-CM | POA: Diagnosis not present

## 2017-12-29 DIAGNOSIS — Z299 Encounter for prophylactic measures, unspecified: Secondary | ICD-10-CM | POA: Diagnosis not present

## 2017-12-29 DIAGNOSIS — Z1339 Encounter for screening examination for other mental health and behavioral disorders: Secondary | ICD-10-CM | POA: Diagnosis not present

## 2017-12-29 DIAGNOSIS — I10 Essential (primary) hypertension: Secondary | ICD-10-CM | POA: Diagnosis not present

## 2017-12-29 DIAGNOSIS — Z0000 Encounter for general adult medical examination without abnormal findings: Secondary | ICD-10-CM | POA: Diagnosis not present

## 2017-12-29 DIAGNOSIS — E559 Vitamin D deficiency, unspecified: Secondary | ICD-10-CM | POA: Diagnosis not present

## 2017-12-29 DIAGNOSIS — Z79899 Other long term (current) drug therapy: Secondary | ICD-10-CM | POA: Diagnosis not present

## 2017-12-29 DIAGNOSIS — Z1211 Encounter for screening for malignant neoplasm of colon: Secondary | ICD-10-CM | POA: Diagnosis not present

## 2017-12-29 DIAGNOSIS — R5383 Other fatigue: Secondary | ICD-10-CM | POA: Diagnosis not present

## 2017-12-29 DIAGNOSIS — Z1331 Encounter for screening for depression: Secondary | ICD-10-CM | POA: Diagnosis not present

## 2017-12-30 DIAGNOSIS — I10 Essential (primary) hypertension: Secondary | ICD-10-CM | POA: Diagnosis not present

## 2017-12-30 DIAGNOSIS — M159 Polyosteoarthritis, unspecified: Secondary | ICD-10-CM | POA: Diagnosis not present

## 2018-01-25 DIAGNOSIS — Z713 Dietary counseling and surveillance: Secondary | ICD-10-CM | POA: Diagnosis not present

## 2018-01-25 DIAGNOSIS — Z6827 Body mass index (BMI) 27.0-27.9, adult: Secondary | ICD-10-CM | POA: Diagnosis not present

## 2018-01-25 DIAGNOSIS — I10 Essential (primary) hypertension: Secondary | ICD-10-CM | POA: Diagnosis not present

## 2018-01-25 DIAGNOSIS — N183 Chronic kidney disease, stage 3 (moderate): Secondary | ICD-10-CM | POA: Diagnosis not present

## 2018-01-25 DIAGNOSIS — Z299 Encounter for prophylactic measures, unspecified: Secondary | ICD-10-CM | POA: Diagnosis not present

## 2018-02-24 DIAGNOSIS — I10 Essential (primary) hypertension: Secondary | ICD-10-CM | POA: Diagnosis not present

## 2018-02-24 DIAGNOSIS — M159 Polyosteoarthritis, unspecified: Secondary | ICD-10-CM | POA: Diagnosis not present

## 2018-03-10 DIAGNOSIS — N183 Chronic kidney disease, stage 3 (moderate): Secondary | ICD-10-CM | POA: Diagnosis not present

## 2018-03-10 DIAGNOSIS — Z299 Encounter for prophylactic measures, unspecified: Secondary | ICD-10-CM | POA: Diagnosis not present

## 2018-03-10 DIAGNOSIS — F419 Anxiety disorder, unspecified: Secondary | ICD-10-CM | POA: Diagnosis not present

## 2018-03-10 DIAGNOSIS — Z6827 Body mass index (BMI) 27.0-27.9, adult: Secondary | ICD-10-CM | POA: Diagnosis not present

## 2018-03-10 DIAGNOSIS — F329 Major depressive disorder, single episode, unspecified: Secondary | ICD-10-CM | POA: Diagnosis not present

## 2018-03-10 DIAGNOSIS — I10 Essential (primary) hypertension: Secondary | ICD-10-CM | POA: Diagnosis not present

## 2018-03-19 DIAGNOSIS — M159 Polyosteoarthritis, unspecified: Secondary | ICD-10-CM | POA: Diagnosis not present

## 2018-03-19 DIAGNOSIS — I10 Essential (primary) hypertension: Secondary | ICD-10-CM | POA: Diagnosis not present

## 2018-04-22 DIAGNOSIS — I10 Essential (primary) hypertension: Secondary | ICD-10-CM | POA: Diagnosis not present

## 2018-04-22 DIAGNOSIS — M159 Polyosteoarthritis, unspecified: Secondary | ICD-10-CM | POA: Diagnosis not present

## 2018-06-01 DIAGNOSIS — Z789 Other specified health status: Secondary | ICD-10-CM | POA: Diagnosis not present

## 2018-06-01 DIAGNOSIS — R350 Frequency of micturition: Secondary | ICD-10-CM | POA: Diagnosis not present

## 2018-06-01 DIAGNOSIS — Z6827 Body mass index (BMI) 27.0-27.9, adult: Secondary | ICD-10-CM | POA: Diagnosis not present

## 2018-06-01 DIAGNOSIS — I10 Essential (primary) hypertension: Secondary | ICD-10-CM | POA: Diagnosis not present

## 2018-06-01 DIAGNOSIS — H6090 Unspecified otitis externa, unspecified ear: Secondary | ICD-10-CM | POA: Diagnosis not present

## 2018-06-01 DIAGNOSIS — Z299 Encounter for prophylactic measures, unspecified: Secondary | ICD-10-CM | POA: Diagnosis not present

## 2018-06-01 DIAGNOSIS — R42 Dizziness and giddiness: Secondary | ICD-10-CM | POA: Diagnosis not present

## 2018-06-03 DIAGNOSIS — M159 Polyosteoarthritis, unspecified: Secondary | ICD-10-CM | POA: Diagnosis not present

## 2018-06-03 DIAGNOSIS — I10 Essential (primary) hypertension: Secondary | ICD-10-CM | POA: Diagnosis not present

## 2018-06-10 DIAGNOSIS — I10 Essential (primary) hypertension: Secondary | ICD-10-CM | POA: Diagnosis not present

## 2018-06-10 DIAGNOSIS — Z299 Encounter for prophylactic measures, unspecified: Secondary | ICD-10-CM | POA: Diagnosis not present

## 2018-06-10 DIAGNOSIS — Z6827 Body mass index (BMI) 27.0-27.9, adult: Secondary | ICD-10-CM | POA: Diagnosis not present

## 2018-06-10 DIAGNOSIS — H6123 Impacted cerumen, bilateral: Secondary | ICD-10-CM | POA: Diagnosis not present

## 2018-07-15 DIAGNOSIS — M159 Polyosteoarthritis, unspecified: Secondary | ICD-10-CM | POA: Diagnosis not present

## 2018-07-15 DIAGNOSIS — I10 Essential (primary) hypertension: Secondary | ICD-10-CM | POA: Diagnosis not present

## 2018-08-03 DIAGNOSIS — I6529 Occlusion and stenosis of unspecified carotid artery: Secondary | ICD-10-CM | POA: Diagnosis not present

## 2018-08-03 DIAGNOSIS — E039 Hypothyroidism, unspecified: Secondary | ICD-10-CM | POA: Diagnosis not present

## 2018-08-03 DIAGNOSIS — Z299 Encounter for prophylactic measures, unspecified: Secondary | ICD-10-CM | POA: Diagnosis not present

## 2018-08-03 DIAGNOSIS — I340 Nonrheumatic mitral (valve) insufficiency: Secondary | ICD-10-CM | POA: Diagnosis not present

## 2018-08-03 DIAGNOSIS — Z6827 Body mass index (BMI) 27.0-27.9, adult: Secondary | ICD-10-CM | POA: Diagnosis not present

## 2018-08-03 DIAGNOSIS — N183 Chronic kidney disease, stage 3 (moderate): Secondary | ICD-10-CM | POA: Diagnosis not present

## 2018-10-20 DIAGNOSIS — I10 Essential (primary) hypertension: Secondary | ICD-10-CM | POA: Diagnosis not present

## 2018-10-20 DIAGNOSIS — M159 Polyosteoarthritis, unspecified: Secondary | ICD-10-CM | POA: Diagnosis not present

## 2018-10-29 DIAGNOSIS — M549 Dorsalgia, unspecified: Secondary | ICD-10-CM | POA: Diagnosis not present

## 2018-10-29 DIAGNOSIS — Z299 Encounter for prophylactic measures, unspecified: Secondary | ICD-10-CM | POA: Diagnosis not present

## 2018-10-29 DIAGNOSIS — I10 Essential (primary) hypertension: Secondary | ICD-10-CM | POA: Diagnosis not present

## 2018-10-29 DIAGNOSIS — Z6827 Body mass index (BMI) 27.0-27.9, adult: Secondary | ICD-10-CM | POA: Diagnosis not present

## 2018-10-29 DIAGNOSIS — K219 Gastro-esophageal reflux disease without esophagitis: Secondary | ICD-10-CM | POA: Diagnosis not present

## 2018-10-29 DIAGNOSIS — R609 Edema, unspecified: Secondary | ICD-10-CM | POA: Diagnosis not present

## 2018-12-06 DIAGNOSIS — F419 Anxiety disorder, unspecified: Secondary | ICD-10-CM | POA: Diagnosis not present

## 2018-12-06 DIAGNOSIS — Z299 Encounter for prophylactic measures, unspecified: Secondary | ICD-10-CM | POA: Diagnosis not present

## 2018-12-06 DIAGNOSIS — I10 Essential (primary) hypertension: Secondary | ICD-10-CM | POA: Diagnosis not present

## 2018-12-06 DIAGNOSIS — Z6827 Body mass index (BMI) 27.0-27.9, adult: Secondary | ICD-10-CM | POA: Diagnosis not present

## 2018-12-06 DIAGNOSIS — G4700 Insomnia, unspecified: Secondary | ICD-10-CM | POA: Diagnosis not present

## 2018-12-17 DIAGNOSIS — Z23 Encounter for immunization: Secondary | ICD-10-CM | POA: Diagnosis not present

## 2018-12-22 DIAGNOSIS — M159 Polyosteoarthritis, unspecified: Secondary | ICD-10-CM | POA: Diagnosis not present

## 2018-12-22 DIAGNOSIS — I10 Essential (primary) hypertension: Secondary | ICD-10-CM | POA: Diagnosis not present

## 2019-01-03 DIAGNOSIS — Z6827 Body mass index (BMI) 27.0-27.9, adult: Secondary | ICD-10-CM | POA: Diagnosis not present

## 2019-01-03 DIAGNOSIS — Z0000 Encounter for general adult medical examination without abnormal findings: Secondary | ICD-10-CM | POA: Diagnosis not present

## 2019-01-03 DIAGNOSIS — Z7189 Other specified counseling: Secondary | ICD-10-CM | POA: Diagnosis not present

## 2019-01-03 DIAGNOSIS — Z79899 Other long term (current) drug therapy: Secondary | ICD-10-CM | POA: Diagnosis not present

## 2019-01-03 DIAGNOSIS — E039 Hypothyroidism, unspecified: Secondary | ICD-10-CM | POA: Diagnosis not present

## 2019-01-03 DIAGNOSIS — E559 Vitamin D deficiency, unspecified: Secondary | ICD-10-CM | POA: Diagnosis not present

## 2019-01-03 DIAGNOSIS — Z1339 Encounter for screening examination for other mental health and behavioral disorders: Secondary | ICD-10-CM | POA: Diagnosis not present

## 2019-01-03 DIAGNOSIS — E7800 Pure hypercholesterolemia, unspecified: Secondary | ICD-10-CM | POA: Diagnosis not present

## 2019-01-03 DIAGNOSIS — Z1331 Encounter for screening for depression: Secondary | ICD-10-CM | POA: Diagnosis not present

## 2019-01-03 DIAGNOSIS — Z1211 Encounter for screening for malignant neoplasm of colon: Secondary | ICD-10-CM | POA: Diagnosis not present

## 2019-01-03 DIAGNOSIS — R5383 Other fatigue: Secondary | ICD-10-CM | POA: Diagnosis not present

## 2019-01-03 DIAGNOSIS — Z299 Encounter for prophylactic measures, unspecified: Secondary | ICD-10-CM | POA: Diagnosis not present

## 2019-01-17 DIAGNOSIS — M159 Polyosteoarthritis, unspecified: Secondary | ICD-10-CM | POA: Diagnosis not present

## 2019-01-17 DIAGNOSIS — I10 Essential (primary) hypertension: Secondary | ICD-10-CM | POA: Diagnosis not present

## 2019-02-08 DIAGNOSIS — N183 Chronic kidney disease, stage 3 unspecified: Secondary | ICD-10-CM | POA: Diagnosis not present

## 2019-02-08 DIAGNOSIS — Z6827 Body mass index (BMI) 27.0-27.9, adult: Secondary | ICD-10-CM | POA: Diagnosis not present

## 2019-02-08 DIAGNOSIS — R55 Syncope and collapse: Secondary | ICD-10-CM | POA: Diagnosis not present

## 2019-02-08 DIAGNOSIS — I10 Essential (primary) hypertension: Secondary | ICD-10-CM | POA: Diagnosis not present

## 2019-02-08 DIAGNOSIS — M19019 Primary osteoarthritis, unspecified shoulder: Secondary | ICD-10-CM | POA: Diagnosis not present

## 2019-02-08 DIAGNOSIS — Z299 Encounter for prophylactic measures, unspecified: Secondary | ICD-10-CM | POA: Diagnosis not present

## 2019-02-10 DIAGNOSIS — N183 Chronic kidney disease, stage 3 unspecified: Secondary | ICD-10-CM | POA: Diagnosis not present

## 2019-02-10 DIAGNOSIS — K8070 Calculus of gallbladder and bile duct without cholecystitis without obstruction: Secondary | ICD-10-CM | POA: Diagnosis present

## 2019-02-10 DIAGNOSIS — B961 Klebsiella pneumoniae [K. pneumoniae] as the cause of diseases classified elsewhere: Secondary | ICD-10-CM | POA: Diagnosis present

## 2019-02-10 DIAGNOSIS — R109 Unspecified abdominal pain: Secondary | ICD-10-CM | POA: Diagnosis not present

## 2019-02-10 DIAGNOSIS — F419 Anxiety disorder, unspecified: Secondary | ICD-10-CM | POA: Diagnosis present

## 2019-02-10 DIAGNOSIS — Z7982 Long term (current) use of aspirin: Secondary | ICD-10-CM | POA: Diagnosis not present

## 2019-02-10 DIAGNOSIS — R7989 Other specified abnormal findings of blood chemistry: Secondary | ICD-10-CM | POA: Diagnosis not present

## 2019-02-10 DIAGNOSIS — N390 Urinary tract infection, site not specified: Secondary | ICD-10-CM | POA: Diagnosis not present

## 2019-02-10 DIAGNOSIS — K859 Acute pancreatitis without necrosis or infection, unspecified: Secondary | ICD-10-CM | POA: Diagnosis not present

## 2019-02-10 DIAGNOSIS — B9620 Unspecified Escherichia coli [E. coli] as the cause of diseases classified elsewhere: Secondary | ICD-10-CM | POA: Diagnosis present

## 2019-02-10 DIAGNOSIS — I129 Hypertensive chronic kidney disease with stage 1 through stage 4 chronic kidney disease, or unspecified chronic kidney disease: Secondary | ICD-10-CM | POA: Diagnosis not present

## 2019-02-10 DIAGNOSIS — K8020 Calculus of gallbladder without cholecystitis without obstruction: Secondary | ICD-10-CM | POA: Diagnosis not present

## 2019-02-10 DIAGNOSIS — R935 Abnormal findings on diagnostic imaging of other abdominal regions, including retroperitoneum: Secondary | ICD-10-CM | POA: Diagnosis not present

## 2019-02-10 DIAGNOSIS — Z20828 Contact with and (suspected) exposure to other viral communicable diseases: Secondary | ICD-10-CM | POA: Diagnosis present

## 2019-02-10 DIAGNOSIS — K449 Diaphragmatic hernia without obstruction or gangrene: Secondary | ICD-10-CM | POA: Diagnosis not present

## 2019-02-10 DIAGNOSIS — E039 Hypothyroidism, unspecified: Secondary | ICD-10-CM | POA: Diagnosis present

## 2019-02-15 DIAGNOSIS — K859 Acute pancreatitis without necrosis or infection, unspecified: Secondary | ICD-10-CM | POA: Diagnosis not present

## 2019-02-24 ENCOUNTER — Other Ambulatory Visit: Payer: Self-pay

## 2019-02-24 ENCOUNTER — Encounter (INDEPENDENT_AMBULATORY_CARE_PROVIDER_SITE_OTHER): Payer: Self-pay | Admitting: Nurse Practitioner

## 2019-02-24 ENCOUNTER — Ambulatory Visit (INDEPENDENT_AMBULATORY_CARE_PROVIDER_SITE_OTHER): Payer: Medicare Other | Admitting: Nurse Practitioner

## 2019-02-24 VITALS — BP 182/75 | HR 66 | Temp 97.3°F | Ht 62.0 in | Wt 151.3 lb

## 2019-02-24 DIAGNOSIS — I10 Essential (primary) hypertension: Secondary | ICD-10-CM | POA: Diagnosis not present

## 2019-02-24 DIAGNOSIS — K8020 Calculus of gallbladder without cholecystitis without obstruction: Secondary | ICD-10-CM

## 2019-02-24 DIAGNOSIS — K8500 Idiopathic acute pancreatitis without necrosis or infection: Secondary | ICD-10-CM

## 2019-02-24 DIAGNOSIS — K859 Acute pancreatitis without necrosis or infection, unspecified: Secondary | ICD-10-CM | POA: Insufficient documentation

## 2019-02-24 DIAGNOSIS — K851 Biliary acute pancreatitis without necrosis or infection: Secondary | ICD-10-CM

## 2019-02-24 NOTE — Patient Instructions (Signed)
  1.  Complete the provided lab order  2.  Call our office if your abdominal pain or nausea/vomiting recurs  3.  Further follow-up to be determined after I have received your hospital records from [facility]

## 2019-02-24 NOTE — Progress Notes (Signed)
Subjective:    Patient ID: Brianna Ford, female    DOB: 10-Feb-1923, 85 y.o.   MRN: [id number]  HPI patient is a 85 year old female with a past medical history of osteoporosis, hypertension, and mitral insufficiency. Past right carotid endarterectomy 2003, hysterectomy and left ankle surgery.   She presents today for follow-up regarding gallstone pancreatitis.  She is accompanied by her niece Juliann Pulse.  Approximately 2 and half weeks ago, she developed generalized abdominal pain and went to the bathroom.  She sat on the commode and passed a large amount of diarrhea and briefly passed out.  When she awakened, she called her sister who came to her home.  She started feeling better without any further diarrhea or abdominal pain.  She did not seek any medical assistance at this time.  One week later, she felt "sick".  She developed generalized abdominal pain and vomited.  Her son took her to [facility] 02/10/2019.  She stated being diagnosed with gallstones and pancreatitis.  She presents without any of her hospital records.  Our office will [facility] to obtain her hospital records to include laboratory studies and abdominal/pelvic CT results.  She states she was in the hospital for few days.  She received IV antibiotics.  She did not require an ERCP.  She was discharged home on oral antibiotics.  She is unable to tell me which antibiotic she took but the last dose was completed yesterday.  She was seen by Dr. Brigitte Pulse 1 and half weeks ago and labs were done.  She does not know the results of her laboratory tests.  Our office has contacted Dr. Raul Del office for these results as well.  Currently, she reports feeling quite well.  She denies having any nausea, vomiting or diarrhea.  No abdominal pain.  She is eating a regular diet.  Her weight is stable.  She is passing a normal formed brown bowel movement daily.  She is urinating without difficulty.  She underwent a colonoscopy possibly at the age of 52 which  was normal.  She is a non-smoker.  No alcohol use.  No known family history of colorectal cancer or pancreatic cancer.  Current Outpatient Medications on File Prior to Visit  Medication Sig Dispense Refill   aspirin EC 81 MG tablet 1 qd     B Complex Vitamins (VITAMIN-B COMPLEX PO) Take by mouth daily.     clonazePAM (KLONOPIN) 0.5 MG tablet Take 0.5 mg by mouth at bedtime.     diltiazem (CARDIZEM CD) 240 MG 24 hr capsule Take 240 mg by mouth daily.     levothyroxine (SYNTHROID) 50 MCG tablet Take 50 mcg by mouth daily before breakfast.     lisinopril (ZESTRIL) 20 MG tablet Take 20 mg by mouth daily.     Multiple Vitamins-Minerals (ICAPS AREDS 2 PO) Take by mouth daily.     omeprazole (PRILOSEC) 20 MG capsule Take 20 mg by mouth daily.     No current facility-administered medications on file prior to visit.    No Known Allergies   Review of Systems see HPI, all other systems reviewed and are negative     Objective:   Physical Exam BP (!) 182/75 (BP Location: Right Arm, Patient Position: Sitting, Cuff Size: Normal)    Pulse 66    Temp (!) 97.3 F (36.3 C) (Oral)    Ht 5\' 2"  (1.575 m)    Wt 151 lb 4.8 oz (68.6 kg)    BMI 27.67 kg/m  General: 85 year old female alert in no acute distress Eyes: Sclera nonicteric, conjunctiva pink Mouth: Poor dentition, no ulcers or lesions Neck: Supple, no lymphadenopathy or thyromegaly Heart: Regular rate and rhythm, soft murmur Lungs: Breath sounds clear throughout Abdomen: Soft, nontender, no masses or organomegaly, positive bowel sounds to all 4 quadrants Extremities: Bilateral trace edema Neuro: Alert and oriented x4, no focal deficits     Assessment & Plan:   22.  85 year old female reports having gallstone pancreatitis admitted to [facility] 02/10/2019.  No further nausea, vomiting, diarrhea or abdominal pain. -I have requested a copy of the patient's admission and discharge summary, laboratory results and abdominal/pelvic  CT imaging from [facility] -Further GI evaluation to be determined after I have reviewed her hospital records -CBC, CMP, amylase, lipase and CRP today -Patient will call our office if she develops nausea, vomiting, abdominal pain or diarrhea  2. HTN  3.  Syncopal episode 2-1/2 weeks ago, possible vasovagal response

## 2019-02-25 LAB — CBC WITH DIFFERENTIAL/PLATELET
Absolute Monocytes: 824 cells/uL (ref 200–950)
Basophils Absolute: 50 cells/uL (ref 0–200)
Basophils Relative: 0.7 %
Eosinophils Absolute: 99 cells/uL (ref 15–500)
Eosinophils Relative: 1.4 %
HCT: 39.3 % (ref 35.0–45.0)
Hemoglobin: 12.9 g/dL (ref 11.7–15.5)
Lymphs Abs: 1967 cells/uL (ref 850–3900)
MCH: 28.5 pg (ref 27.0–33.0)
MCHC: 32.8 g/dL (ref 32.0–36.0)
MCV: 86.9 fL (ref 80.0–100.0)
MPV: 12 fL (ref 7.5–12.5)
Monocytes Relative: 11.6 %
Neutro Abs: 4161 cells/uL (ref 1500–7800)
Neutrophils Relative %: 58.6 %
Platelets: 203 10*3/uL (ref 140–400)
RBC: 4.52 10*6/uL (ref 3.80–5.10)
RDW: 13.1 % (ref 11.0–15.0)
Total Lymphocyte: 27.7 %
WBC: 7.1 10*3/uL (ref 3.8–10.8)

## 2019-02-25 LAB — LIPASE: Lipase: 27 U/L (ref 7–60)

## 2019-02-25 LAB — COMPLETE METABOLIC PANEL WITH GFR
AG Ratio: 1.9 (calc) (ref 1.0–2.5)
ALT: 18 U/L (ref 6–29)
AST: 19 U/L (ref 10–35)
Albumin: 4.8 g/dL (ref 3.6–5.1)
Alkaline phosphatase (APISO): 89 U/L (ref 37–153)
BUN/Creatinine Ratio: 15 (calc) (ref 6–22)
BUN: 14 mg/dL (ref 7–25)
CO2: 27 mmol/L (ref 20–32)
Calcium: 9.7 mg/dL (ref 8.6–10.4)
Chloride: 105 mmol/L (ref 98–110)
Creat: 0.96 mg/dL — ABNORMAL HIGH (ref 0.60–0.88)
GFR, Est African American: 59 mL/min/{1.73_m2} — ABNORMAL LOW (ref >=60)
GFR, Est Non African American: 51 mL/min/{1.73_m2} — ABNORMAL LOW (ref >=60)
Globulin: 2.5 g/dL (calc) (ref 1.9–3.7)
Glucose, Bld: 91 mg/dL (ref 65–139)
Potassium: 4.1 mmol/L (ref 3.5–5.3)
Sodium: 143 mmol/L (ref 135–146)
Total Bilirubin: 0.4 mg/dL (ref 0.2–1.2)
Total Protein: 7.3 g/dL (ref 6.1–8.1)

## 2019-02-25 LAB — C-REACTIVE PROTEIN: CRP: 5.1 mg/L (ref <8.0)

## 2019-02-25 LAB — AMYLASE: Amylase: 42 U/L (ref 21–101)

## 2019-03-07 DIAGNOSIS — N183 Chronic kidney disease, stage 3 unspecified: Secondary | ICD-10-CM | POA: Diagnosis not present

## 2019-03-07 DIAGNOSIS — Z6827 Body mass index (BMI) 27.0-27.9, adult: Secondary | ICD-10-CM | POA: Diagnosis not present

## 2019-03-07 DIAGNOSIS — I10 Essential (primary) hypertension: Secondary | ICD-10-CM | POA: Diagnosis not present

## 2019-03-07 DIAGNOSIS — I7300 Raynaud's syndrome without gangrene: Secondary | ICD-10-CM | POA: Diagnosis not present

## 2019-03-07 DIAGNOSIS — Z299 Encounter for prophylactic measures, unspecified: Secondary | ICD-10-CM | POA: Diagnosis not present

## 2019-03-10 NOTE — Progress Notes (Addendum)
I received the patient's records from [facility].   An abdominal/pelvic CT with contrast 02/10/2019: 1.  Mild edematous thickening of the pancreatic head and mucinous process with hazy adjacent stranding consistent with acute interstitial edematous pancreatitis.  No evidence of pancreatic necrosis. 2.  Mild intra and extrahepatic biliary ductal dilatation.  No calcified gallstones seen within the gallbladder lumen or biliary tree.  Correlate with LFTs and consider further evaluation with right upper quadrant ultrasound or MRCP as clinically indicated. 3.  Focal hyper attenuating mural thickening at the gallbladder fundus may reflect adenomyomatosis. 4.  Moderate hiatal hernia. 5.  Bilateral cortical scarring of both kidneys. 6.  Aortic atherosclerosis.  Coronary calcification.  Abdominal MRI/MRCP without contrast 02/10/2019: 1.  Cholelithiasis.  Borderline prominent common bile duct with abrupt truncation in the vicinity of the small ampulla but without a visible filling defect to suggest choledocholithiasis. 2.  Suspected adenomyosis of the gallbladder fundus. 3.  Pancreas divisum.  Minimal if any peripancreatic edema. 4.  Small periampullary duodenal diverticulum. 5.  Mild cardiomegaly. 6.  Multilevel lumbar impingement. 7.  Aortic atherosclerosis.  Labs 02/10/2019: Potassium 4.2.  BUN 21.  Creatinine 1.10.  Glucose 199.  Calcium 9.3.  Total bili 1.1. AST 125.2.  ALT 74.  Alk phos 131.  Albumin 4.4.  Lipase 1685.  WBC 10.0.  Hemoglobin 13.0.  Hematocrit 40.7.  MCV 90.0.  Platelet 205.  Urine culture greater than 100,000 CFU/mL of gram-negative rods, E. coli and Klebsiella pneumoniae +.    Labs 02/15/2019 as ordered by Dr. Manuella Ghazi: Sodium 144.  Potassium 4.7.  BUN 12.  Creatinine 1.13.  Glucose 98.  Albumin 4.6.  Total bili 0.3.  Alk phos 129.  AST 32.  ALT 56.  Amylase 59.  Lipase 36.  Labs 02/24/2019: Sodium 143.  Potassium 4.1.  Glucose 91.  BUN 14.  Creatinine 0.96.  Lipase 27.   Amylase 42.  Alk phos 89.  Total bili 0.4.  AST 19.  ALT 18.  CRP 5.1.  WBC 7.1.  Hemoglobin 12.9.  Hematocrit 39.3.  Platelet 203.  I called the patient today for symptom update.  She reports feeling quite well.  No nausea or vomiting.  No upper or lower abdominal pain.  No chest pain or back pain.  She will call our office if she develops any of these symptoms.  She is 85 years old therefore conservative management is preferred.  I will forward this update to Dr. Laural Golden for his review.  Further recommendations per Dr. Laural Golden.   Addendum.  I was able to review CT and MRCP with help of Dr. Thornton Papas as he was able to pull the images through PACS. There is no evidence of choledocholithiasis.  Since patient has cholelithiasis she is at risk for another episode of biliary pancreatitis cholangitis or cholecystitis.  She is somewhat high risk for cholecystectomy and therefore decision made to monitor her symptoms. If she has another episode of biliary pancreatitis would definitely offer ERCP.

## 2019-03-16 ENCOUNTER — Other Ambulatory Visit (INDEPENDENT_AMBULATORY_CARE_PROVIDER_SITE_OTHER): Payer: Self-pay | Admitting: *Deleted

## 2019-03-16 DIAGNOSIS — K851 Biliary acute pancreatitis without necrosis or infection: Secondary | ICD-10-CM

## 2019-03-21 ENCOUNTER — Other Ambulatory Visit (INDEPENDENT_AMBULATORY_CARE_PROVIDER_SITE_OTHER): Payer: Self-pay | Admitting: *Deleted

## 2019-03-21 DIAGNOSIS — K851 Biliary acute pancreatitis without necrosis or infection: Secondary | ICD-10-CM

## 2019-03-28 DIAGNOSIS — M159 Polyosteoarthritis, unspecified: Secondary | ICD-10-CM | POA: Diagnosis not present

## 2019-03-28 DIAGNOSIS — I10 Essential (primary) hypertension: Secondary | ICD-10-CM | POA: Diagnosis not present

## 2019-05-04 DIAGNOSIS — K5909 Other constipation: Secondary | ICD-10-CM | POA: Diagnosis not present

## 2019-05-04 DIAGNOSIS — Z789 Other specified health status: Secondary | ICD-10-CM | POA: Diagnosis not present

## 2019-05-04 DIAGNOSIS — N183 Chronic kidney disease, stage 3 unspecified: Secondary | ICD-10-CM | POA: Diagnosis not present

## 2019-05-04 DIAGNOSIS — I10 Essential (primary) hypertension: Secondary | ICD-10-CM | POA: Diagnosis not present

## 2019-05-04 DIAGNOSIS — Z6827 Body mass index (BMI) 27.0-27.9, adult: Secondary | ICD-10-CM | POA: Diagnosis not present

## 2019-05-04 DIAGNOSIS — K8020 Calculus of gallbladder without cholecystitis without obstruction: Secondary | ICD-10-CM | POA: Diagnosis not present

## 2019-05-04 DIAGNOSIS — Z299 Encounter for prophylactic measures, unspecified: Secondary | ICD-10-CM | POA: Diagnosis not present

## 2019-05-06 DIAGNOSIS — M159 Polyosteoarthritis, unspecified: Secondary | ICD-10-CM | POA: Diagnosis not present

## 2019-05-06 DIAGNOSIS — I10 Essential (primary) hypertension: Secondary | ICD-10-CM | POA: Diagnosis not present

## 2019-06-01 DIAGNOSIS — K5909 Other constipation: Secondary | ICD-10-CM | POA: Diagnosis not present

## 2019-06-01 DIAGNOSIS — Z299 Encounter for prophylactic measures, unspecified: Secondary | ICD-10-CM | POA: Diagnosis not present

## 2019-06-01 DIAGNOSIS — Z6827 Body mass index (BMI) 27.0-27.9, adult: Secondary | ICD-10-CM | POA: Diagnosis not present

## 2019-06-01 DIAGNOSIS — I10 Essential (primary) hypertension: Secondary | ICD-10-CM | POA: Diagnosis not present

## 2019-06-01 DIAGNOSIS — Z789 Other specified health status: Secondary | ICD-10-CM | POA: Diagnosis not present

## 2019-06-03 DIAGNOSIS — M159 Polyosteoarthritis, unspecified: Secondary | ICD-10-CM | POA: Diagnosis not present

## 2019-06-03 DIAGNOSIS — I10 Essential (primary) hypertension: Secondary | ICD-10-CM | POA: Diagnosis not present

## 2019-06-08 DIAGNOSIS — Z23 Encounter for immunization: Secondary | ICD-10-CM | POA: Diagnosis not present

## 2019-06-20 DIAGNOSIS — I340 Nonrheumatic mitral (valve) insufficiency: Secondary | ICD-10-CM | POA: Diagnosis not present

## 2019-06-27 DIAGNOSIS — M159 Polyosteoarthritis, unspecified: Secondary | ICD-10-CM | POA: Diagnosis not present

## 2019-06-27 DIAGNOSIS — I10 Essential (primary) hypertension: Secondary | ICD-10-CM | POA: Diagnosis not present

## 2019-07-06 DIAGNOSIS — Z23 Encounter for immunization: Secondary | ICD-10-CM | POA: Diagnosis not present

## 2019-08-02 DIAGNOSIS — I10 Essential (primary) hypertension: Secondary | ICD-10-CM | POA: Diagnosis not present

## 2019-08-02 DIAGNOSIS — M159 Polyosteoarthritis, unspecified: Secondary | ICD-10-CM | POA: Diagnosis not present

## 2019-09-20 DIAGNOSIS — G4700 Insomnia, unspecified: Secondary | ICD-10-CM | POA: Diagnosis not present

## 2019-09-20 DIAGNOSIS — I10 Essential (primary) hypertension: Secondary | ICD-10-CM | POA: Diagnosis not present

## 2019-09-20 DIAGNOSIS — N183 Chronic kidney disease, stage 3 unspecified: Secondary | ICD-10-CM | POA: Diagnosis not present

## 2019-09-20 DIAGNOSIS — F419 Anxiety disorder, unspecified: Secondary | ICD-10-CM | POA: Diagnosis not present

## 2019-09-20 DIAGNOSIS — Z299 Encounter for prophylactic measures, unspecified: Secondary | ICD-10-CM | POA: Diagnosis not present

## 2019-10-05 DIAGNOSIS — I10 Essential (primary) hypertension: Secondary | ICD-10-CM | POA: Diagnosis not present

## 2019-10-05 DIAGNOSIS — M159 Polyosteoarthritis, unspecified: Secondary | ICD-10-CM | POA: Diagnosis not present

## 2019-10-24 DIAGNOSIS — M79672 Pain in left foot: Secondary | ICD-10-CM | POA: Diagnosis not present

## 2019-10-24 DIAGNOSIS — M79671 Pain in right foot: Secondary | ICD-10-CM | POA: Diagnosis not present

## 2019-10-24 DIAGNOSIS — I739 Peripheral vascular disease, unspecified: Secondary | ICD-10-CM | POA: Diagnosis not present

## 2019-10-24 DIAGNOSIS — L600 Ingrowing nail: Secondary | ICD-10-CM | POA: Diagnosis not present

## 2019-11-04 DIAGNOSIS — I10 Essential (primary) hypertension: Secondary | ICD-10-CM | POA: Diagnosis not present

## 2019-11-04 DIAGNOSIS — M159 Polyosteoarthritis, unspecified: Secondary | ICD-10-CM | POA: Diagnosis not present

## 2019-11-08 DIAGNOSIS — M159 Polyosteoarthritis, unspecified: Secondary | ICD-10-CM | POA: Diagnosis not present

## 2019-11-08 DIAGNOSIS — I10 Essential (primary) hypertension: Secondary | ICD-10-CM | POA: Diagnosis not present

## 2019-11-15 DIAGNOSIS — F419 Anxiety disorder, unspecified: Secondary | ICD-10-CM | POA: Diagnosis not present

## 2019-11-15 DIAGNOSIS — I10 Essential (primary) hypertension: Secondary | ICD-10-CM | POA: Diagnosis not present

## 2019-11-15 DIAGNOSIS — K5909 Other constipation: Secondary | ICD-10-CM | POA: Diagnosis not present

## 2019-11-15 DIAGNOSIS — Z299 Encounter for prophylactic measures, unspecified: Secondary | ICD-10-CM | POA: Diagnosis not present

## 2019-11-15 DIAGNOSIS — Z789 Other specified health status: Secondary | ICD-10-CM | POA: Diagnosis not present

## 2019-11-15 DIAGNOSIS — R1084 Generalized abdominal pain: Secondary | ICD-10-CM | POA: Diagnosis not present

## 2019-11-22 DIAGNOSIS — K589 Irritable bowel syndrome without diarrhea: Secondary | ICD-10-CM | POA: Diagnosis not present

## 2020-01-05 DIAGNOSIS — I10 Essential (primary) hypertension: Secondary | ICD-10-CM | POA: Diagnosis not present

## 2020-01-05 DIAGNOSIS — M159 Polyosteoarthritis, unspecified: Secondary | ICD-10-CM | POA: Diagnosis not present

## 2020-01-09 DIAGNOSIS — Z1331 Encounter for screening for depression: Secondary | ICD-10-CM | POA: Diagnosis not present

## 2020-01-09 DIAGNOSIS — E039 Hypothyroidism, unspecified: Secondary | ICD-10-CM | POA: Diagnosis not present

## 2020-01-09 DIAGNOSIS — Z0000 Encounter for general adult medical examination without abnormal findings: Secondary | ICD-10-CM | POA: Diagnosis not present

## 2020-01-09 DIAGNOSIS — I10 Essential (primary) hypertension: Secondary | ICD-10-CM | POA: Diagnosis not present

## 2020-01-09 DIAGNOSIS — Z7189 Other specified counseling: Secondary | ICD-10-CM | POA: Diagnosis not present

## 2020-01-09 DIAGNOSIS — I779 Disorder of arteries and arterioles, unspecified: Secondary | ICD-10-CM | POA: Diagnosis not present

## 2020-01-09 DIAGNOSIS — Z1339 Encounter for screening examination for other mental health and behavioral disorders: Secondary | ICD-10-CM | POA: Diagnosis not present

## 2020-01-09 DIAGNOSIS — Z299 Encounter for prophylactic measures, unspecified: Secondary | ICD-10-CM | POA: Diagnosis not present

## 2020-01-09 DIAGNOSIS — E7800 Pure hypercholesterolemia, unspecified: Secondary | ICD-10-CM | POA: Diagnosis not present

## 2020-01-09 DIAGNOSIS — E559 Vitamin D deficiency, unspecified: Secondary | ICD-10-CM | POA: Diagnosis not present

## 2020-01-09 DIAGNOSIS — R5383 Other fatigue: Secondary | ICD-10-CM | POA: Diagnosis not present

## 2020-01-09 DIAGNOSIS — Z6826 Body mass index (BMI) 26.0-26.9, adult: Secondary | ICD-10-CM | POA: Diagnosis not present

## 2020-01-09 DIAGNOSIS — Z79899 Other long term (current) drug therapy: Secondary | ICD-10-CM | POA: Diagnosis not present

## 2020-01-16 DIAGNOSIS — L600 Ingrowing nail: Secondary | ICD-10-CM | POA: Diagnosis not present

## 2020-01-16 DIAGNOSIS — M79671 Pain in right foot: Secondary | ICD-10-CM | POA: Diagnosis not present

## 2020-01-16 DIAGNOSIS — M79672 Pain in left foot: Secondary | ICD-10-CM | POA: Diagnosis not present

## 2020-01-16 DIAGNOSIS — I739 Peripheral vascular disease, unspecified: Secondary | ICD-10-CM | POA: Diagnosis not present

## 2020-01-23 DIAGNOSIS — I63039 Cerebral infarction due to thrombosis of unspecified carotid artery: Secondary | ICD-10-CM | POA: Diagnosis not present

## 2020-01-23 DIAGNOSIS — I6523 Occlusion and stenosis of bilateral carotid arteries: Secondary | ICD-10-CM | POA: Diagnosis not present

## 2020-02-03 DIAGNOSIS — I10 Essential (primary) hypertension: Secondary | ICD-10-CM | POA: Diagnosis not present

## 2020-02-03 DIAGNOSIS — M159 Polyosteoarthritis, unspecified: Secondary | ICD-10-CM | POA: Diagnosis not present

## 2020-02-15 DIAGNOSIS — S3991XA Unspecified injury of abdomen, initial encounter: Secondary | ICD-10-CM | POA: Diagnosis not present

## 2020-02-15 DIAGNOSIS — M25521 Pain in right elbow: Secondary | ICD-10-CM | POA: Diagnosis not present

## 2020-02-15 DIAGNOSIS — W19XXXA Unspecified fall, initial encounter: Secondary | ICD-10-CM | POA: Diagnosis not present

## 2020-02-15 DIAGNOSIS — W010XXA Fall on same level from slipping, tripping and stumbling without subsequent striking against object, initial encounter: Secondary | ICD-10-CM | POA: Diagnosis not present

## 2020-02-15 DIAGNOSIS — Z7982 Long term (current) use of aspirin: Secondary | ICD-10-CM | POA: Diagnosis not present

## 2020-02-15 DIAGNOSIS — I447 Left bundle-branch block, unspecified: Secondary | ICD-10-CM | POA: Diagnosis not present

## 2020-02-15 DIAGNOSIS — J90 Pleural effusion, not elsewhere classified: Secondary | ICD-10-CM | POA: Diagnosis not present

## 2020-02-15 DIAGNOSIS — M47812 Spondylosis without myelopathy or radiculopathy, cervical region: Secondary | ICD-10-CM | POA: Diagnosis not present

## 2020-02-15 DIAGNOSIS — K5730 Diverticulosis of large intestine without perforation or abscess without bleeding: Secondary | ICD-10-CM | POA: Diagnosis not present

## 2020-02-15 DIAGNOSIS — I700 Atherosclerosis of aorta: Secondary | ICD-10-CM | POA: Diagnosis not present

## 2020-02-15 DIAGNOSIS — M25572 Pain in left ankle and joints of left foot: Secondary | ICD-10-CM | POA: Diagnosis not present

## 2020-02-15 DIAGNOSIS — S50311A Abrasion of right elbow, initial encounter: Secondary | ICD-10-CM | POA: Diagnosis not present

## 2020-02-15 DIAGNOSIS — G319 Degenerative disease of nervous system, unspecified: Secondary | ICD-10-CM | POA: Diagnosis not present

## 2020-02-15 DIAGNOSIS — R2689 Other abnormalities of gait and mobility: Secondary | ICD-10-CM | POA: Diagnosis not present

## 2020-02-15 DIAGNOSIS — R9431 Abnormal electrocardiogram [ECG] [EKG]: Secondary | ICD-10-CM | POA: Diagnosis not present

## 2020-02-15 DIAGNOSIS — M1611 Unilateral primary osteoarthritis, right hip: Secondary | ICD-10-CM | POA: Diagnosis not present

## 2020-02-15 DIAGNOSIS — Z20822 Contact with and (suspected) exposure to covid-19: Secondary | ICD-10-CM | POA: Diagnosis not present

## 2020-02-15 DIAGNOSIS — E079 Disorder of thyroid, unspecified: Secondary | ICD-10-CM | POA: Diagnosis not present

## 2020-02-15 DIAGNOSIS — S2243XA Multiple fractures of ribs, bilateral, initial encounter for closed fracture: Secondary | ICD-10-CM | POA: Diagnosis not present

## 2020-02-15 DIAGNOSIS — J9811 Atelectasis: Secondary | ICD-10-CM | POA: Diagnosis not present

## 2020-02-15 DIAGNOSIS — R296 Repeated falls: Secondary | ICD-10-CM | POA: Diagnosis not present

## 2020-02-15 DIAGNOSIS — M810 Age-related osteoporosis without current pathological fracture: Secondary | ICD-10-CM | POA: Diagnosis not present

## 2020-02-15 DIAGNOSIS — G9389 Other specified disorders of brain: Secondary | ICD-10-CM | POA: Diagnosis not present

## 2020-02-15 DIAGNOSIS — I6782 Cerebral ischemia: Secondary | ICD-10-CM | POA: Diagnosis not present

## 2020-02-15 DIAGNOSIS — M4312 Spondylolisthesis, cervical region: Secondary | ICD-10-CM | POA: Diagnosis not present

## 2020-02-15 DIAGNOSIS — R7989 Other specified abnormal findings of blood chemistry: Secondary | ICD-10-CM | POA: Diagnosis not present

## 2020-02-15 DIAGNOSIS — R55 Syncope and collapse: Secondary | ICD-10-CM | POA: Diagnosis not present

## 2020-02-15 DIAGNOSIS — J439 Emphysema, unspecified: Secondary | ICD-10-CM | POA: Diagnosis not present

## 2020-02-15 DIAGNOSIS — Z79899 Other long term (current) drug therapy: Secondary | ICD-10-CM | POA: Diagnosis not present

## 2020-02-15 DIAGNOSIS — I2510 Atherosclerotic heart disease of native coronary artery without angina pectoris: Secondary | ICD-10-CM | POA: Diagnosis not present

## 2020-02-15 DIAGNOSIS — I10 Essential (primary) hypertension: Secondary | ICD-10-CM | POA: Diagnosis not present

## 2020-02-15 DIAGNOSIS — M4802 Spinal stenosis, cervical region: Secondary | ICD-10-CM | POA: Diagnosis not present

## 2020-02-15 DIAGNOSIS — Z8781 Personal history of (healed) traumatic fracture: Secondary | ICD-10-CM | POA: Diagnosis not present

## 2020-02-15 DIAGNOSIS — M47816 Spondylosis without myelopathy or radiculopathy, lumbar region: Secondary | ICD-10-CM | POA: Diagnosis not present

## 2020-02-15 DIAGNOSIS — S2241XA Multiple fractures of ribs, right side, initial encounter for closed fracture: Secondary | ICD-10-CM | POA: Diagnosis not present

## 2020-02-15 DIAGNOSIS — S225XXA Flail chest, initial encounter for closed fracture: Secondary | ICD-10-CM | POA: Diagnosis not present

## 2020-02-16 DIAGNOSIS — Z23 Encounter for immunization: Secondary | ICD-10-CM | POA: Diagnosis not present

## 2020-02-16 DIAGNOSIS — S2241XA Multiple fractures of ribs, right side, initial encounter for closed fracture: Secondary | ICD-10-CM | POA: Diagnosis not present

## 2020-02-17 DIAGNOSIS — I493 Ventricular premature depolarization: Secondary | ICD-10-CM | POA: Diagnosis not present

## 2020-02-17 DIAGNOSIS — W19XXXD Unspecified fall, subsequent encounter: Secondary | ICD-10-CM | POA: Diagnosis not present

## 2020-02-17 DIAGNOSIS — E039 Hypothyroidism, unspecified: Secondary | ICD-10-CM | POA: Diagnosis not present

## 2020-02-17 DIAGNOSIS — Z9181 History of falling: Secondary | ICD-10-CM | POA: Diagnosis not present

## 2020-02-17 DIAGNOSIS — R55 Syncope and collapse: Secondary | ICD-10-CM | POA: Diagnosis not present

## 2020-02-17 DIAGNOSIS — F329 Major depressive disorder, single episode, unspecified: Secondary | ICD-10-CM | POA: Diagnosis not present

## 2020-02-17 DIAGNOSIS — I447 Left bundle-branch block, unspecified: Secondary | ICD-10-CM | POA: Diagnosis not present

## 2020-02-17 DIAGNOSIS — I739 Peripheral vascular disease, unspecified: Secondary | ICD-10-CM | POA: Diagnosis not present

## 2020-02-17 DIAGNOSIS — I7300 Raynaud's syndrome without gangrene: Secondary | ICD-10-CM | POA: Diagnosis not present

## 2020-02-17 DIAGNOSIS — I6529 Occlusion and stenosis of unspecified carotid artery: Secondary | ICD-10-CM | POA: Diagnosis not present

## 2020-02-17 DIAGNOSIS — F419 Anxiety disorder, unspecified: Secondary | ICD-10-CM | POA: Diagnosis not present

## 2020-02-17 DIAGNOSIS — H5461 Unqualified visual loss, right eye, normal vision left eye: Secondary | ICD-10-CM | POA: Diagnosis not present

## 2020-02-17 DIAGNOSIS — H3530 Unspecified macular degeneration: Secondary | ICD-10-CM | POA: Diagnosis not present

## 2020-02-17 DIAGNOSIS — I10 Essential (primary) hypertension: Secondary | ICD-10-CM | POA: Diagnosis not present

## 2020-02-17 DIAGNOSIS — S2241XD Multiple fractures of ribs, right side, subsequent encounter for fracture with routine healing: Secondary | ICD-10-CM | POA: Diagnosis not present

## 2020-02-27 DIAGNOSIS — Z299 Encounter for prophylactic measures, unspecified: Secondary | ICD-10-CM | POA: Diagnosis not present

## 2020-02-27 DIAGNOSIS — S2239XA Fracture of one rib, unspecified side, initial encounter for closed fracture: Secondary | ICD-10-CM | POA: Diagnosis not present

## 2020-02-27 DIAGNOSIS — I7300 Raynaud's syndrome without gangrene: Secondary | ICD-10-CM | POA: Diagnosis not present

## 2020-02-27 DIAGNOSIS — Z6826 Body mass index (BMI) 26.0-26.9, adult: Secondary | ICD-10-CM | POA: Diagnosis not present

## 2020-02-27 DIAGNOSIS — F419 Anxiety disorder, unspecified: Secondary | ICD-10-CM | POA: Diagnosis not present

## 2020-03-06 DIAGNOSIS — M159 Polyosteoarthritis, unspecified: Secondary | ICD-10-CM | POA: Diagnosis not present

## 2020-03-06 DIAGNOSIS — I10 Essential (primary) hypertension: Secondary | ICD-10-CM | POA: Diagnosis not present

## 2020-03-07 DIAGNOSIS — H3530 Unspecified macular degeneration: Secondary | ICD-10-CM | POA: Diagnosis not present

## 2020-03-07 DIAGNOSIS — I10 Essential (primary) hypertension: Secondary | ICD-10-CM | POA: Diagnosis not present

## 2020-03-07 DIAGNOSIS — S2241XD Multiple fractures of ribs, right side, subsequent encounter for fracture with routine healing: Secondary | ICD-10-CM | POA: Diagnosis not present

## 2020-03-07 DIAGNOSIS — F419 Anxiety disorder, unspecified: Secondary | ICD-10-CM | POA: Diagnosis not present

## 2020-03-07 DIAGNOSIS — I739 Peripheral vascular disease, unspecified: Secondary | ICD-10-CM | POA: Diagnosis not present

## 2020-03-07 DIAGNOSIS — H5461 Unqualified visual loss, right eye, normal vision left eye: Secondary | ICD-10-CM | POA: Diagnosis not present

## 2020-03-09 DIAGNOSIS — I739 Peripheral vascular disease, unspecified: Secondary | ICD-10-CM | POA: Diagnosis not present

## 2020-03-09 DIAGNOSIS — S2241XD Multiple fractures of ribs, right side, subsequent encounter for fracture with routine healing: Secondary | ICD-10-CM | POA: Diagnosis not present

## 2020-03-09 DIAGNOSIS — F419 Anxiety disorder, unspecified: Secondary | ICD-10-CM | POA: Diagnosis not present

## 2020-03-09 DIAGNOSIS — H3530 Unspecified macular degeneration: Secondary | ICD-10-CM | POA: Diagnosis not present

## 2020-03-09 DIAGNOSIS — H5461 Unqualified visual loss, right eye, normal vision left eye: Secondary | ICD-10-CM | POA: Diagnosis not present

## 2020-03-09 DIAGNOSIS — I10 Essential (primary) hypertension: Secondary | ICD-10-CM | POA: Diagnosis not present

## 2020-03-12 DIAGNOSIS — I739 Peripheral vascular disease, unspecified: Secondary | ICD-10-CM | POA: Diagnosis not present

## 2020-03-12 DIAGNOSIS — H5461 Unqualified visual loss, right eye, normal vision left eye: Secondary | ICD-10-CM | POA: Diagnosis not present

## 2020-03-12 DIAGNOSIS — I10 Essential (primary) hypertension: Secondary | ICD-10-CM | POA: Diagnosis not present

## 2020-03-12 DIAGNOSIS — H3530 Unspecified macular degeneration: Secondary | ICD-10-CM | POA: Diagnosis not present

## 2020-03-12 DIAGNOSIS — S2241XD Multiple fractures of ribs, right side, subsequent encounter for fracture with routine healing: Secondary | ICD-10-CM | POA: Diagnosis not present

## 2020-03-12 DIAGNOSIS — F419 Anxiety disorder, unspecified: Secondary | ICD-10-CM | POA: Diagnosis not present

## 2020-03-13 DIAGNOSIS — I10 Essential (primary) hypertension: Secondary | ICD-10-CM | POA: Diagnosis not present

## 2020-03-13 DIAGNOSIS — I739 Peripheral vascular disease, unspecified: Secondary | ICD-10-CM | POA: Diagnosis not present

## 2020-03-13 DIAGNOSIS — H3530 Unspecified macular degeneration: Secondary | ICD-10-CM | POA: Diagnosis not present

## 2020-03-13 DIAGNOSIS — S2241XD Multiple fractures of ribs, right side, subsequent encounter for fracture with routine healing: Secondary | ICD-10-CM | POA: Diagnosis not present

## 2020-03-14 DIAGNOSIS — F419 Anxiety disorder, unspecified: Secondary | ICD-10-CM | POA: Diagnosis not present

## 2020-03-14 DIAGNOSIS — I739 Peripheral vascular disease, unspecified: Secondary | ICD-10-CM | POA: Diagnosis not present

## 2020-03-14 DIAGNOSIS — H5461 Unqualified visual loss, right eye, normal vision left eye: Secondary | ICD-10-CM | POA: Diagnosis not present

## 2020-03-14 DIAGNOSIS — S2241XD Multiple fractures of ribs, right side, subsequent encounter for fracture with routine healing: Secondary | ICD-10-CM | POA: Diagnosis not present

## 2020-03-14 DIAGNOSIS — H3530 Unspecified macular degeneration: Secondary | ICD-10-CM | POA: Diagnosis not present

## 2020-03-14 DIAGNOSIS — I10 Essential (primary) hypertension: Secondary | ICD-10-CM | POA: Diagnosis not present

## 2020-03-16 DIAGNOSIS — H5461 Unqualified visual loss, right eye, normal vision left eye: Secondary | ICD-10-CM | POA: Diagnosis not present

## 2020-03-16 DIAGNOSIS — I739 Peripheral vascular disease, unspecified: Secondary | ICD-10-CM | POA: Diagnosis not present

## 2020-03-16 DIAGNOSIS — I10 Essential (primary) hypertension: Secondary | ICD-10-CM | POA: Diagnosis not present

## 2020-03-16 DIAGNOSIS — S2241XD Multiple fractures of ribs, right side, subsequent encounter for fracture with routine healing: Secondary | ICD-10-CM | POA: Diagnosis not present

## 2020-03-16 DIAGNOSIS — H3530 Unspecified macular degeneration: Secondary | ICD-10-CM | POA: Diagnosis not present

## 2020-03-16 DIAGNOSIS — F419 Anxiety disorder, unspecified: Secondary | ICD-10-CM | POA: Diagnosis not present

## 2020-03-18 DIAGNOSIS — I7300 Raynaud's syndrome without gangrene: Secondary | ICD-10-CM | POA: Diagnosis not present

## 2020-03-18 DIAGNOSIS — F329 Major depressive disorder, single episode, unspecified: Secondary | ICD-10-CM | POA: Diagnosis not present

## 2020-03-18 DIAGNOSIS — E039 Hypothyroidism, unspecified: Secondary | ICD-10-CM | POA: Diagnosis not present

## 2020-03-18 DIAGNOSIS — H5461 Unqualified visual loss, right eye, normal vision left eye: Secondary | ICD-10-CM | POA: Diagnosis not present

## 2020-03-18 DIAGNOSIS — F419 Anxiety disorder, unspecified: Secondary | ICD-10-CM | POA: Diagnosis not present

## 2020-03-18 DIAGNOSIS — I6529 Occlusion and stenosis of unspecified carotid artery: Secondary | ICD-10-CM | POA: Diagnosis not present

## 2020-03-18 DIAGNOSIS — H3530 Unspecified macular degeneration: Secondary | ICD-10-CM | POA: Diagnosis not present

## 2020-03-18 DIAGNOSIS — Z9181 History of falling: Secondary | ICD-10-CM | POA: Diagnosis not present

## 2020-03-18 DIAGNOSIS — R55 Syncope and collapse: Secondary | ICD-10-CM | POA: Diagnosis not present

## 2020-03-18 DIAGNOSIS — I739 Peripheral vascular disease, unspecified: Secondary | ICD-10-CM | POA: Diagnosis not present

## 2020-03-18 DIAGNOSIS — I10 Essential (primary) hypertension: Secondary | ICD-10-CM | POA: Diagnosis not present

## 2020-03-18 DIAGNOSIS — W19XXXD Unspecified fall, subsequent encounter: Secondary | ICD-10-CM | POA: Diagnosis not present

## 2020-03-18 DIAGNOSIS — S2241XD Multiple fractures of ribs, right side, subsequent encounter for fracture with routine healing: Secondary | ICD-10-CM | POA: Diagnosis not present

## 2020-03-19 DIAGNOSIS — H5461 Unqualified visual loss, right eye, normal vision left eye: Secondary | ICD-10-CM | POA: Diagnosis not present

## 2020-03-19 DIAGNOSIS — I739 Peripheral vascular disease, unspecified: Secondary | ICD-10-CM | POA: Diagnosis not present

## 2020-03-19 DIAGNOSIS — H3530 Unspecified macular degeneration: Secondary | ICD-10-CM | POA: Diagnosis not present

## 2020-03-19 DIAGNOSIS — F419 Anxiety disorder, unspecified: Secondary | ICD-10-CM | POA: Diagnosis not present

## 2020-03-19 DIAGNOSIS — S2241XD Multiple fractures of ribs, right side, subsequent encounter for fracture with routine healing: Secondary | ICD-10-CM | POA: Diagnosis not present

## 2020-03-19 DIAGNOSIS — I10 Essential (primary) hypertension: Secondary | ICD-10-CM | POA: Diagnosis not present

## 2020-03-21 DIAGNOSIS — I10 Essential (primary) hypertension: Secondary | ICD-10-CM | POA: Diagnosis not present

## 2020-03-21 DIAGNOSIS — I739 Peripheral vascular disease, unspecified: Secondary | ICD-10-CM | POA: Diagnosis not present

## 2020-03-21 DIAGNOSIS — H5461 Unqualified visual loss, right eye, normal vision left eye: Secondary | ICD-10-CM | POA: Diagnosis not present

## 2020-03-21 DIAGNOSIS — F419 Anxiety disorder, unspecified: Secondary | ICD-10-CM | POA: Diagnosis not present

## 2020-03-21 DIAGNOSIS — H3530 Unspecified macular degeneration: Secondary | ICD-10-CM | POA: Diagnosis not present

## 2020-03-21 DIAGNOSIS — S2241XD Multiple fractures of ribs, right side, subsequent encounter for fracture with routine healing: Secondary | ICD-10-CM | POA: Diagnosis not present

## 2020-03-28 DIAGNOSIS — S2241XD Multiple fractures of ribs, right side, subsequent encounter for fracture with routine healing: Secondary | ICD-10-CM | POA: Diagnosis not present

## 2020-03-28 DIAGNOSIS — H5461 Unqualified visual loss, right eye, normal vision left eye: Secondary | ICD-10-CM | POA: Diagnosis not present

## 2020-03-28 DIAGNOSIS — H3530 Unspecified macular degeneration: Secondary | ICD-10-CM | POA: Diagnosis not present

## 2020-03-28 DIAGNOSIS — I739 Peripheral vascular disease, unspecified: Secondary | ICD-10-CM | POA: Diagnosis not present

## 2020-03-28 DIAGNOSIS — I10 Essential (primary) hypertension: Secondary | ICD-10-CM | POA: Diagnosis not present

## 2020-03-28 DIAGNOSIS — F419 Anxiety disorder, unspecified: Secondary | ICD-10-CM | POA: Diagnosis not present

## 2020-04-04 DIAGNOSIS — H5461 Unqualified visual loss, right eye, normal vision left eye: Secondary | ICD-10-CM | POA: Diagnosis not present

## 2020-04-04 DIAGNOSIS — H3530 Unspecified macular degeneration: Secondary | ICD-10-CM | POA: Diagnosis not present

## 2020-04-04 DIAGNOSIS — I10 Essential (primary) hypertension: Secondary | ICD-10-CM | POA: Diagnosis not present

## 2020-04-04 DIAGNOSIS — S2241XD Multiple fractures of ribs, right side, subsequent encounter for fracture with routine healing: Secondary | ICD-10-CM | POA: Diagnosis not present

## 2020-04-04 DIAGNOSIS — F419 Anxiety disorder, unspecified: Secondary | ICD-10-CM | POA: Diagnosis not present

## 2020-04-04 DIAGNOSIS — I739 Peripheral vascular disease, unspecified: Secondary | ICD-10-CM | POA: Diagnosis not present

## 2020-04-05 DIAGNOSIS — M159 Polyosteoarthritis, unspecified: Secondary | ICD-10-CM | POA: Diagnosis not present

## 2020-04-05 DIAGNOSIS — I10 Essential (primary) hypertension: Secondary | ICD-10-CM | POA: Diagnosis not present

## 2020-04-09 DIAGNOSIS — Z23 Encounter for immunization: Secondary | ICD-10-CM | POA: Diagnosis not present

## 2020-04-12 DIAGNOSIS — N183 Chronic kidney disease, stage 3 unspecified: Secondary | ICD-10-CM | POA: Diagnosis not present

## 2020-04-12 DIAGNOSIS — Z789 Other specified health status: Secondary | ICD-10-CM | POA: Diagnosis not present

## 2020-04-12 DIAGNOSIS — S2239XA Fracture of one rib, unspecified side, initial encounter for closed fracture: Secondary | ICD-10-CM | POA: Diagnosis not present

## 2020-04-12 DIAGNOSIS — I779 Disorder of arteries and arterioles, unspecified: Secondary | ICD-10-CM | POA: Diagnosis not present

## 2020-04-12 DIAGNOSIS — Z6827 Body mass index (BMI) 27.0-27.9, adult: Secondary | ICD-10-CM | POA: Diagnosis not present

## 2020-04-12 DIAGNOSIS — Z299 Encounter for prophylactic measures, unspecified: Secondary | ICD-10-CM | POA: Diagnosis not present

## 2020-04-12 DIAGNOSIS — I10 Essential (primary) hypertension: Secondary | ICD-10-CM | POA: Diagnosis not present

## 2020-06-04 DIAGNOSIS — F419 Anxiety disorder, unspecified: Secondary | ICD-10-CM | POA: Diagnosis not present

## 2020-06-04 DIAGNOSIS — F329 Major depressive disorder, single episode, unspecified: Secondary | ICD-10-CM | POA: Diagnosis not present

## 2020-06-04 DIAGNOSIS — I10 Essential (primary) hypertension: Secondary | ICD-10-CM | POA: Diagnosis not present

## 2020-06-04 DIAGNOSIS — G4700 Insomnia, unspecified: Secondary | ICD-10-CM | POA: Diagnosis not present

## 2020-07-31 DIAGNOSIS — Z299 Encounter for prophylactic measures, unspecified: Secondary | ICD-10-CM | POA: Diagnosis not present

## 2020-07-31 DIAGNOSIS — I7300 Raynaud's syndrome without gangrene: Secondary | ICD-10-CM | POA: Diagnosis not present

## 2020-07-31 DIAGNOSIS — I6529 Occlusion and stenosis of unspecified carotid artery: Secondary | ICD-10-CM | POA: Diagnosis not present

## 2020-07-31 DIAGNOSIS — F419 Anxiety disorder, unspecified: Secondary | ICD-10-CM | POA: Diagnosis not present

## 2020-07-31 DIAGNOSIS — I10 Essential (primary) hypertension: Secondary | ICD-10-CM | POA: Diagnosis not present

## 2020-08-04 DIAGNOSIS — I10 Essential (primary) hypertension: Secondary | ICD-10-CM | POA: Diagnosis not present

## 2020-08-04 DIAGNOSIS — G4700 Insomnia, unspecified: Secondary | ICD-10-CM | POA: Diagnosis not present

## 2020-08-04 DIAGNOSIS — F329 Major depressive disorder, single episode, unspecified: Secondary | ICD-10-CM | POA: Diagnosis not present

## 2020-08-04 DIAGNOSIS — F419 Anxiety disorder, unspecified: Secondary | ICD-10-CM | POA: Diagnosis not present

## 2020-10-04 DIAGNOSIS — I10 Essential (primary) hypertension: Secondary | ICD-10-CM | POA: Diagnosis not present

## 2020-10-04 DIAGNOSIS — F419 Anxiety disorder, unspecified: Secondary | ICD-10-CM | POA: Diagnosis not present

## 2020-10-04 DIAGNOSIS — G4700 Insomnia, unspecified: Secondary | ICD-10-CM | POA: Diagnosis not present

## 2020-10-04 DIAGNOSIS — F329 Major depressive disorder, single episode, unspecified: Secondary | ICD-10-CM | POA: Diagnosis not present

## 2020-12-05 DIAGNOSIS — I10 Essential (primary) hypertension: Secondary | ICD-10-CM | POA: Diagnosis not present

## 2020-12-05 DIAGNOSIS — F419 Anxiety disorder, unspecified: Secondary | ICD-10-CM | POA: Diagnosis not present

## 2020-12-05 DIAGNOSIS — F329 Major depressive disorder, single episode, unspecified: Secondary | ICD-10-CM | POA: Diagnosis not present

## 2020-12-05 DIAGNOSIS — G4700 Insomnia, unspecified: Secondary | ICD-10-CM | POA: Diagnosis not present

## 2021-01-11 DIAGNOSIS — Z1339 Encounter for screening examination for other mental health and behavioral disorders: Secondary | ICD-10-CM | POA: Diagnosis not present

## 2021-01-11 DIAGNOSIS — Z0000 Encounter for general adult medical examination without abnormal findings: Secondary | ICD-10-CM | POA: Diagnosis not present

## 2021-01-11 DIAGNOSIS — I10 Essential (primary) hypertension: Secondary | ICD-10-CM | POA: Diagnosis not present

## 2021-01-11 DIAGNOSIS — R5383 Other fatigue: Secondary | ICD-10-CM | POA: Diagnosis not present

## 2021-01-11 DIAGNOSIS — E7800 Pure hypercholesterolemia, unspecified: Secondary | ICD-10-CM | POA: Diagnosis not present

## 2021-01-11 DIAGNOSIS — E039 Hypothyroidism, unspecified: Secondary | ICD-10-CM | POA: Diagnosis not present

## 2021-01-11 DIAGNOSIS — Z299 Encounter for prophylactic measures, unspecified: Secondary | ICD-10-CM | POA: Diagnosis not present

## 2021-01-11 DIAGNOSIS — Z79899 Other long term (current) drug therapy: Secondary | ICD-10-CM | POA: Diagnosis not present

## 2021-01-11 DIAGNOSIS — E559 Vitamin D deficiency, unspecified: Secondary | ICD-10-CM | POA: Diagnosis not present

## 2021-01-11 DIAGNOSIS — Z1331 Encounter for screening for depression: Secondary | ICD-10-CM | POA: Diagnosis not present

## 2021-01-11 DIAGNOSIS — Z7189 Other specified counseling: Secondary | ICD-10-CM | POA: Diagnosis not present

## 2021-01-11 DIAGNOSIS — Z23 Encounter for immunization: Secondary | ICD-10-CM | POA: Diagnosis not present

## 2021-01-11 DIAGNOSIS — Z6825 Body mass index (BMI) 25.0-25.9, adult: Secondary | ICD-10-CM | POA: Diagnosis not present

## 2021-01-23 DIAGNOSIS — Z299 Encounter for prophylactic measures, unspecified: Secondary | ICD-10-CM | POA: Diagnosis not present

## 2021-01-23 DIAGNOSIS — B079 Viral wart, unspecified: Secondary | ICD-10-CM | POA: Diagnosis not present

## 2021-01-23 DIAGNOSIS — I10 Essential (primary) hypertension: Secondary | ICD-10-CM | POA: Diagnosis not present

## 2021-02-20 DIAGNOSIS — R079 Chest pain, unspecified: Secondary | ICD-10-CM | POA: Diagnosis not present

## 2021-02-20 DIAGNOSIS — R002 Palpitations: Secondary | ICD-10-CM | POA: Diagnosis not present

## 2021-02-20 DIAGNOSIS — I10 Essential (primary) hypertension: Secondary | ICD-10-CM | POA: Diagnosis not present

## 2021-02-20 DIAGNOSIS — Z299 Encounter for prophylactic measures, unspecified: Secondary | ICD-10-CM | POA: Diagnosis not present

## 2021-02-20 DIAGNOSIS — R42 Dizziness and giddiness: Secondary | ICD-10-CM | POA: Diagnosis not present

## 2021-03-06 DIAGNOSIS — I10 Essential (primary) hypertension: Secondary | ICD-10-CM | POA: Diagnosis not present

## 2021-03-06 DIAGNOSIS — E7800 Pure hypercholesterolemia, unspecified: Secondary | ICD-10-CM | POA: Diagnosis not present

## 2021-04-05 DIAGNOSIS — E7800 Pure hypercholesterolemia, unspecified: Secondary | ICD-10-CM | POA: Diagnosis not present

## 2021-04-12 DIAGNOSIS — L570 Actinic keratosis: Secondary | ICD-10-CM | POA: Diagnosis not present

## 2021-04-12 DIAGNOSIS — Z6825 Body mass index (BMI) 25.0-25.9, adult: Secondary | ICD-10-CM | POA: Diagnosis not present

## 2021-04-12 DIAGNOSIS — Z789 Other specified health status: Secondary | ICD-10-CM | POA: Diagnosis not present

## 2021-04-12 DIAGNOSIS — N183 Chronic kidney disease, stage 3 unspecified: Secondary | ICD-10-CM | POA: Diagnosis not present

## 2021-04-12 DIAGNOSIS — C050 Malignant neoplasm of hard palate: Secondary | ICD-10-CM | POA: Diagnosis not present

## 2021-04-12 DIAGNOSIS — L989 Disorder of the skin and subcutaneous tissue, unspecified: Secondary | ICD-10-CM | POA: Diagnosis not present

## 2021-04-12 DIAGNOSIS — I10 Essential (primary) hypertension: Secondary | ICD-10-CM | POA: Diagnosis not present

## 2021-04-12 DIAGNOSIS — J309 Allergic rhinitis, unspecified: Secondary | ICD-10-CM | POA: Diagnosis not present

## 2021-04-12 DIAGNOSIS — Z299 Encounter for prophylactic measures, unspecified: Secondary | ICD-10-CM | POA: Diagnosis not present

## 2021-05-20 DIAGNOSIS — S0083XA Contusion of other part of head, initial encounter: Secondary | ICD-10-CM | POA: Diagnosis not present

## 2021-05-20 DIAGNOSIS — S0990XA Unspecified injury of head, initial encounter: Secondary | ICD-10-CM | POA: Diagnosis not present

## 2021-05-20 DIAGNOSIS — S0003XA Contusion of scalp, initial encounter: Secondary | ICD-10-CM | POA: Diagnosis not present

## 2021-05-20 DIAGNOSIS — G319 Degenerative disease of nervous system, unspecified: Secondary | ICD-10-CM | POA: Diagnosis not present

## 2021-05-20 DIAGNOSIS — W1839XA Other fall on same level, initial encounter: Secondary | ICD-10-CM | POA: Diagnosis not present

## 2021-05-20 DIAGNOSIS — S0081XA Abrasion of other part of head, initial encounter: Secondary | ICD-10-CM | POA: Diagnosis not present

## 2021-05-22 DIAGNOSIS — Z299 Encounter for prophylactic measures, unspecified: Secondary | ICD-10-CM | POA: Diagnosis not present

## 2021-05-22 DIAGNOSIS — Z789 Other specified health status: Secondary | ICD-10-CM | POA: Diagnosis not present

## 2021-05-22 DIAGNOSIS — I10 Essential (primary) hypertension: Secondary | ICD-10-CM | POA: Diagnosis not present

## 2021-05-22 DIAGNOSIS — W19XXXA Unspecified fall, initial encounter: Secondary | ICD-10-CM | POA: Diagnosis not present

## 2021-06-05 DIAGNOSIS — I10 Essential (primary) hypertension: Secondary | ICD-10-CM | POA: Diagnosis not present

## 2021-06-05 DIAGNOSIS — Z299 Encounter for prophylactic measures, unspecified: Secondary | ICD-10-CM | POA: Diagnosis not present

## 2021-06-05 DIAGNOSIS — L570 Actinic keratosis: Secondary | ICD-10-CM | POA: Diagnosis not present

## 2021-07-04 DIAGNOSIS — F329 Major depressive disorder, single episode, unspecified: Secondary | ICD-10-CM | POA: Diagnosis not present

## 2021-07-04 DIAGNOSIS — F419 Anxiety disorder, unspecified: Secondary | ICD-10-CM | POA: Diagnosis not present

## 2021-07-04 DIAGNOSIS — I10 Essential (primary) hypertension: Secondary | ICD-10-CM | POA: Diagnosis not present

## 2021-07-04 DIAGNOSIS — G4700 Insomnia, unspecified: Secondary | ICD-10-CM | POA: Diagnosis not present

## 2021-11-18 DIAGNOSIS — Z6826 Body mass index (BMI) 26.0-26.9, adult: Secondary | ICD-10-CM | POA: Diagnosis not present

## 2021-11-18 DIAGNOSIS — Z299 Encounter for prophylactic measures, unspecified: Secondary | ICD-10-CM | POA: Diagnosis not present

## 2021-11-18 DIAGNOSIS — H6120 Impacted cerumen, unspecified ear: Secondary | ICD-10-CM | POA: Diagnosis not present

## 2021-11-18 DIAGNOSIS — K5900 Constipation, unspecified: Secondary | ICD-10-CM | POA: Diagnosis not present

## 2021-11-18 DIAGNOSIS — R1084 Generalized abdominal pain: Secondary | ICD-10-CM | POA: Diagnosis not present

## 2022-01-15 DIAGNOSIS — Z23 Encounter for immunization: Secondary | ICD-10-CM | POA: Diagnosis not present

## 2022-01-15 DIAGNOSIS — Z1331 Encounter for screening for depression: Secondary | ICD-10-CM | POA: Diagnosis not present

## 2022-01-15 DIAGNOSIS — E039 Hypothyroidism, unspecified: Secondary | ICD-10-CM | POA: Diagnosis not present

## 2022-01-15 DIAGNOSIS — Z1339 Encounter for screening examination for other mental health and behavioral disorders: Secondary | ICD-10-CM | POA: Diagnosis not present

## 2022-01-15 DIAGNOSIS — R5383 Other fatigue: Secondary | ICD-10-CM | POA: Diagnosis not present

## 2022-01-15 DIAGNOSIS — Z6826 Body mass index (BMI) 26.0-26.9, adult: Secondary | ICD-10-CM | POA: Diagnosis not present

## 2022-01-15 DIAGNOSIS — Z0000 Encounter for general adult medical examination without abnormal findings: Secondary | ICD-10-CM | POA: Diagnosis not present

## 2022-01-15 DIAGNOSIS — E559 Vitamin D deficiency, unspecified: Secondary | ICD-10-CM | POA: Diagnosis not present

## 2022-01-15 DIAGNOSIS — Z79899 Other long term (current) drug therapy: Secondary | ICD-10-CM | POA: Diagnosis not present

## 2022-01-15 DIAGNOSIS — E7800 Pure hypercholesterolemia, unspecified: Secondary | ICD-10-CM | POA: Diagnosis not present

## 2022-01-15 DIAGNOSIS — Z7189 Other specified counseling: Secondary | ICD-10-CM | POA: Diagnosis not present

## 2022-01-15 DIAGNOSIS — Z299 Encounter for prophylactic measures, unspecified: Secondary | ICD-10-CM | POA: Diagnosis not present

## 2022-01-20 DIAGNOSIS — R600 Localized edema: Secondary | ICD-10-CM | POA: Diagnosis not present

## 2022-02-17 DIAGNOSIS — Z299 Encounter for prophylactic measures, unspecified: Secondary | ICD-10-CM | POA: Diagnosis not present

## 2022-02-17 DIAGNOSIS — H6120 Impacted cerumen, unspecified ear: Secondary | ICD-10-CM | POA: Diagnosis not present

## 2022-02-17 DIAGNOSIS — I10 Essential (primary) hypertension: Secondary | ICD-10-CM | POA: Diagnosis not present

## 2022-05-07 DIAGNOSIS — W1839XA Other fall on same level, initial encounter: Secondary | ICD-10-CM | POA: Diagnosis not present

## 2022-05-07 DIAGNOSIS — T24212A Burn of second degree of left thigh, initial encounter: Secondary | ICD-10-CM | POA: Diagnosis not present

## 2022-05-07 DIAGNOSIS — T2112XA Burn of first degree of abdominal wall, initial encounter: Secondary | ICD-10-CM | POA: Diagnosis not present

## 2022-05-07 DIAGNOSIS — T24211A Burn of second degree of right thigh, initial encounter: Secondary | ICD-10-CM | POA: Diagnosis not present

## 2022-05-07 DIAGNOSIS — T2122XA Burn of second degree of abdominal wall, initial encounter: Secondary | ICD-10-CM | POA: Diagnosis not present

## 2022-05-12 DIAGNOSIS — T24009A Burn of unspecified degree of unspecified site of unspecified lower limb, except ankle and foot, initial encounter: Secondary | ICD-10-CM | POA: Diagnosis not present

## 2022-05-12 DIAGNOSIS — Z299 Encounter for prophylactic measures, unspecified: Secondary | ICD-10-CM | POA: Diagnosis not present

## 2022-05-12 DIAGNOSIS — I10 Essential (primary) hypertension: Secondary | ICD-10-CM | POA: Diagnosis not present

## 2022-05-12 DIAGNOSIS — T300 Burn of unspecified body region, unspecified degree: Secondary | ICD-10-CM | POA: Diagnosis not present

## 2022-05-12 DIAGNOSIS — Z6825 Body mass index (BMI) 25.0-25.9, adult: Secondary | ICD-10-CM | POA: Diagnosis not present

## 2022-05-12 DIAGNOSIS — K219 Gastro-esophageal reflux disease without esophagitis: Secondary | ICD-10-CM | POA: Diagnosis not present

## 2022-05-26 DIAGNOSIS — Z299 Encounter for prophylactic measures, unspecified: Secondary | ICD-10-CM | POA: Diagnosis not present

## 2022-05-26 DIAGNOSIS — N183 Chronic kidney disease, stage 3 unspecified: Secondary | ICD-10-CM | POA: Diagnosis not present

## 2022-05-26 DIAGNOSIS — I10 Essential (primary) hypertension: Secondary | ICD-10-CM | POA: Diagnosis not present

## 2022-05-26 DIAGNOSIS — T24009A Burn of unspecified degree of unspecified site of unspecified lower limb, except ankle and foot, initial encounter: Secondary | ICD-10-CM | POA: Diagnosis not present

## 2022-06-20 DIAGNOSIS — T24009A Burn of unspecified degree of unspecified site of unspecified lower limb, except ankle and foot, initial encounter: Secondary | ICD-10-CM | POA: Diagnosis not present

## 2022-06-20 DIAGNOSIS — I10 Essential (primary) hypertension: Secondary | ICD-10-CM | POA: Diagnosis not present

## 2022-06-20 DIAGNOSIS — Z299 Encounter for prophylactic measures, unspecified: Secondary | ICD-10-CM | POA: Diagnosis not present

## 2022-08-12 DIAGNOSIS — Z79899 Other long term (current) drug therapy: Secondary | ICD-10-CM | POA: Diagnosis not present

## 2022-08-12 DIAGNOSIS — K5900 Constipation, unspecified: Secondary | ICD-10-CM | POA: Diagnosis not present

## 2022-08-12 DIAGNOSIS — R55 Syncope and collapse: Secondary | ICD-10-CM | POA: Diagnosis not present

## 2022-08-12 DIAGNOSIS — I10 Essential (primary) hypertension: Secondary | ICD-10-CM | POA: Diagnosis not present

## 2022-08-12 DIAGNOSIS — N3001 Acute cystitis with hematuria: Secondary | ICD-10-CM | POA: Diagnosis not present

## 2022-08-12 DIAGNOSIS — R9431 Abnormal electrocardiogram [ECG] [EKG]: Secondary | ICD-10-CM | POA: Diagnosis not present

## 2022-08-12 DIAGNOSIS — I491 Atrial premature depolarization: Secondary | ICD-10-CM | POA: Diagnosis not present

## 2022-08-12 DIAGNOSIS — Z7982 Long term (current) use of aspirin: Secondary | ICD-10-CM | POA: Diagnosis not present

## 2022-08-12 DIAGNOSIS — I447 Left bundle-branch block, unspecified: Secondary | ICD-10-CM | POA: Diagnosis not present

## 2022-08-12 DIAGNOSIS — Z87891 Personal history of nicotine dependence: Secondary | ICD-10-CM | POA: Diagnosis not present

## 2022-10-21 DIAGNOSIS — E039 Hypothyroidism, unspecified: Secondary | ICD-10-CM | POA: Diagnosis not present

## 2022-10-21 DIAGNOSIS — M25572 Pain in left ankle and joints of left foot: Secondary | ICD-10-CM | POA: Diagnosis not present

## 2022-10-21 DIAGNOSIS — I10 Essential (primary) hypertension: Secondary | ICD-10-CM | POA: Diagnosis not present

## 2022-10-21 DIAGNOSIS — Z299 Encounter for prophylactic measures, unspecified: Secondary | ICD-10-CM | POA: Diagnosis not present

## 2022-10-21 DIAGNOSIS — R5383 Other fatigue: Secondary | ICD-10-CM | POA: Diagnosis not present

## 2022-12-17 DIAGNOSIS — I129 Hypertensive chronic kidney disease with stage 1 through stage 4 chronic kidney disease, or unspecified chronic kidney disease: Secondary | ICD-10-CM | POA: Diagnosis not present

## 2022-12-17 DIAGNOSIS — J9811 Atelectasis: Secondary | ICD-10-CM | POA: Diagnosis not present

## 2022-12-17 DIAGNOSIS — E860 Dehydration: Secondary | ICD-10-CM | POA: Diagnosis not present

## 2022-12-17 DIAGNOSIS — K5730 Diverticulosis of large intestine without perforation or abscess without bleeding: Secondary | ICD-10-CM | POA: Diagnosis not present

## 2022-12-17 DIAGNOSIS — E079 Disorder of thyroid, unspecified: Secondary | ICD-10-CM | POA: Diagnosis not present

## 2022-12-17 DIAGNOSIS — R109 Unspecified abdominal pain: Secondary | ICD-10-CM | POA: Diagnosis not present

## 2022-12-17 DIAGNOSIS — N183 Chronic kidney disease, stage 3 unspecified: Secondary | ICD-10-CM | POA: Diagnosis not present

## 2022-12-17 DIAGNOSIS — K5900 Constipation, unspecified: Secondary | ICD-10-CM | POA: Diagnosis not present

## 2023-01-03 DIAGNOSIS — I454 Nonspecific intraventricular block: Secondary | ICD-10-CM | POA: Diagnosis not present

## 2023-01-03 DIAGNOSIS — M25551 Pain in right hip: Secondary | ICD-10-CM | POA: Diagnosis not present

## 2023-01-03 DIAGNOSIS — R9082 White matter disease, unspecified: Secondary | ICD-10-CM | POA: Diagnosis not present

## 2023-01-03 DIAGNOSIS — M47816 Spondylosis without myelopathy or radiculopathy, lumbar region: Secondary | ICD-10-CM | POA: Diagnosis not present

## 2023-01-03 DIAGNOSIS — W1839XA Other fall on same level, initial encounter: Secondary | ICD-10-CM | POA: Diagnosis not present

## 2023-01-03 DIAGNOSIS — I700 Atherosclerosis of aorta: Secondary | ICD-10-CM | POA: Diagnosis not present

## 2023-01-03 DIAGNOSIS — M8580 Other specified disorders of bone density and structure, unspecified site: Secondary | ICD-10-CM | POA: Diagnosis not present

## 2023-01-03 DIAGNOSIS — S2231XA Fracture of one rib, right side, initial encounter for closed fracture: Secondary | ICD-10-CM | POA: Diagnosis not present

## 2023-01-03 DIAGNOSIS — M8588 Other specified disorders of bone density and structure, other site: Secondary | ICD-10-CM | POA: Diagnosis not present

## 2023-01-03 DIAGNOSIS — Z87891 Personal history of nicotine dependence: Secondary | ICD-10-CM | POA: Diagnosis not present

## 2023-01-03 DIAGNOSIS — Z7982 Long term (current) use of aspirin: Secondary | ICD-10-CM | POA: Diagnosis not present

## 2023-01-03 DIAGNOSIS — I10 Essential (primary) hypertension: Secondary | ICD-10-CM | POA: Diagnosis not present

## 2023-01-03 DIAGNOSIS — R519 Headache, unspecified: Secondary | ICD-10-CM | POA: Diagnosis not present

## 2023-01-03 DIAGNOSIS — R55 Syncope and collapse: Secondary | ICD-10-CM | POA: Diagnosis not present

## 2023-01-03 DIAGNOSIS — N3000 Acute cystitis without hematuria: Secondary | ICD-10-CM | POA: Diagnosis not present

## 2023-01-03 DIAGNOSIS — M542 Cervicalgia: Secondary | ICD-10-CM | POA: Diagnosis not present

## 2023-01-03 DIAGNOSIS — R9431 Abnormal electrocardiogram [ECG] [EKG]: Secondary | ICD-10-CM | POA: Diagnosis not present

## 2023-01-03 DIAGNOSIS — R0781 Pleurodynia: Secondary | ICD-10-CM | POA: Diagnosis not present

## 2023-01-03 DIAGNOSIS — R9089 Other abnormal findings on diagnostic imaging of central nervous system: Secondary | ICD-10-CM | POA: Diagnosis not present

## 2023-01-03 DIAGNOSIS — I447 Left bundle-branch block, unspecified: Secondary | ICD-10-CM | POA: Diagnosis not present

## 2023-01-07 DIAGNOSIS — I10 Essential (primary) hypertension: Secondary | ICD-10-CM | POA: Diagnosis not present

## 2023-01-07 DIAGNOSIS — Z7982 Long term (current) use of aspirin: Secondary | ICD-10-CM | POA: Diagnosis not present

## 2023-01-07 DIAGNOSIS — Z87891 Personal history of nicotine dependence: Secondary | ICD-10-CM | POA: Diagnosis not present

## 2023-01-07 DIAGNOSIS — R109 Unspecified abdominal pain: Secondary | ICD-10-CM | POA: Diagnosis not present

## 2023-01-07 DIAGNOSIS — K5900 Constipation, unspecified: Secondary | ICD-10-CM | POA: Diagnosis not present

## 2023-01-07 DIAGNOSIS — Z79891 Long term (current) use of opiate analgesic: Secondary | ICD-10-CM | POA: Diagnosis not present

## 2023-01-09 DIAGNOSIS — Z299 Encounter for prophylactic measures, unspecified: Secondary | ICD-10-CM | POA: Diagnosis not present

## 2023-01-09 DIAGNOSIS — N390 Urinary tract infection, site not specified: Secondary | ICD-10-CM | POA: Diagnosis not present

## 2023-01-09 DIAGNOSIS — Z23 Encounter for immunization: Secondary | ICD-10-CM | POA: Diagnosis not present

## 2023-01-09 DIAGNOSIS — I10 Essential (primary) hypertension: Secondary | ICD-10-CM | POA: Diagnosis not present

## 2023-01-09 DIAGNOSIS — N183 Chronic kidney disease, stage 3 unspecified: Secondary | ICD-10-CM | POA: Diagnosis not present

## 2023-01-09 DIAGNOSIS — K5900 Constipation, unspecified: Secondary | ICD-10-CM | POA: Diagnosis not present

## 2023-01-24 DIAGNOSIS — R519 Headache, unspecified: Secondary | ICD-10-CM | POA: Diagnosis not present

## 2023-01-24 DIAGNOSIS — S06320A Contusion and laceration of left cerebrum without loss of consciousness, initial encounter: Secondary | ICD-10-CM | POA: Diagnosis not present

## 2023-01-24 DIAGNOSIS — E079 Disorder of thyroid, unspecified: Secondary | ICD-10-CM | POA: Diagnosis not present

## 2023-01-24 DIAGNOSIS — S0093XA Contusion of unspecified part of head, initial encounter: Secondary | ICD-10-CM | POA: Diagnosis not present

## 2023-01-24 DIAGNOSIS — Z87891 Personal history of nicotine dependence: Secondary | ICD-10-CM | POA: Diagnosis not present

## 2023-01-24 DIAGNOSIS — M4312 Spondylolisthesis, cervical region: Secondary | ICD-10-CM | POA: Diagnosis not present

## 2023-01-24 DIAGNOSIS — S0191XA Laceration without foreign body of unspecified part of head, initial encounter: Secondary | ICD-10-CM | POA: Diagnosis not present

## 2023-01-24 DIAGNOSIS — R220 Localized swelling, mass and lump, head: Secondary | ICD-10-CM | POA: Diagnosis not present

## 2023-01-24 DIAGNOSIS — S0990XA Unspecified injury of head, initial encounter: Secondary | ICD-10-CM | POA: Diagnosis not present

## 2023-01-24 DIAGNOSIS — Z7982 Long term (current) use of aspirin: Secondary | ICD-10-CM | POA: Diagnosis not present

## 2023-01-24 DIAGNOSIS — W1839XA Other fall on same level, initial encounter: Secondary | ICD-10-CM | POA: Diagnosis not present

## 2023-01-24 DIAGNOSIS — I10 Essential (primary) hypertension: Secondary | ICD-10-CM | POA: Diagnosis not present

## 2023-01-24 DIAGNOSIS — S199XXA Unspecified injury of neck, initial encounter: Secondary | ICD-10-CM | POA: Diagnosis not present

## 2023-01-26 DIAGNOSIS — Z299 Encounter for prophylactic measures, unspecified: Secondary | ICD-10-CM | POA: Diagnosis not present

## 2023-01-26 DIAGNOSIS — N183 Chronic kidney disease, stage 3 unspecified: Secondary | ICD-10-CM | POA: Diagnosis not present

## 2023-01-26 DIAGNOSIS — I779 Disorder of arteries and arterioles, unspecified: Secondary | ICD-10-CM | POA: Diagnosis not present

## 2023-01-26 DIAGNOSIS — I10 Essential (primary) hypertension: Secondary | ICD-10-CM | POA: Diagnosis not present

## 2023-01-26 DIAGNOSIS — S0101XA Laceration without foreign body of scalp, initial encounter: Secondary | ICD-10-CM | POA: Diagnosis not present

## 2023-02-05 DIAGNOSIS — E7800 Pure hypercholesterolemia, unspecified: Secondary | ICD-10-CM | POA: Diagnosis not present

## 2023-02-05 DIAGNOSIS — E039 Hypothyroidism, unspecified: Secondary | ICD-10-CM | POA: Diagnosis not present

## 2023-02-05 DIAGNOSIS — R5383 Other fatigue: Secondary | ICD-10-CM | POA: Diagnosis not present

## 2023-02-05 DIAGNOSIS — I10 Essential (primary) hypertension: Secondary | ICD-10-CM | POA: Diagnosis not present

## 2023-02-05 DIAGNOSIS — Z1339 Encounter for screening examination for other mental health and behavioral disorders: Secondary | ICD-10-CM | POA: Diagnosis not present

## 2023-02-05 DIAGNOSIS — Z0000 Encounter for general adult medical examination without abnormal findings: Secondary | ICD-10-CM | POA: Diagnosis not present

## 2023-02-05 DIAGNOSIS — Z7189 Other specified counseling: Secondary | ICD-10-CM | POA: Diagnosis not present

## 2023-02-05 DIAGNOSIS — Z299 Encounter for prophylactic measures, unspecified: Secondary | ICD-10-CM | POA: Diagnosis not present

## 2023-02-05 DIAGNOSIS — Z1331 Encounter for screening for depression: Secondary | ICD-10-CM | POA: Diagnosis not present

## 2023-12-14 DIAGNOSIS — M79671 Pain in right foot: Secondary | ICD-10-CM | POA: Diagnosis not present

## 2023-12-14 DIAGNOSIS — M79674 Pain in right toe(s): Secondary | ICD-10-CM | POA: Diagnosis not present

## 2023-12-14 DIAGNOSIS — L609 Nail disorder, unspecified: Secondary | ICD-10-CM | POA: Diagnosis not present

## 2023-12-14 DIAGNOSIS — I739 Peripheral vascular disease, unspecified: Secondary | ICD-10-CM | POA: Diagnosis not present

## 2023-12-14 DIAGNOSIS — B351 Tinea unguium: Secondary | ICD-10-CM | POA: Diagnosis not present

## 2023-12-14 DIAGNOSIS — M79672 Pain in left foot: Secondary | ICD-10-CM | POA: Diagnosis not present

## 2023-12-14 DIAGNOSIS — L600 Ingrowing nail: Secondary | ICD-10-CM | POA: Diagnosis not present

## 2023-12-14 DIAGNOSIS — M79675 Pain in left toe(s): Secondary | ICD-10-CM | POA: Diagnosis not present

## 2024-02-08 DIAGNOSIS — R5383 Other fatigue: Secondary | ICD-10-CM | POA: Diagnosis not present

## 2024-02-08 DIAGNOSIS — E7800 Pure hypercholesterolemia, unspecified: Secondary | ICD-10-CM | POA: Diagnosis not present

## 2024-02-08 DIAGNOSIS — I10 Essential (primary) hypertension: Secondary | ICD-10-CM | POA: Diagnosis not present

## 2024-02-08 DIAGNOSIS — E559 Vitamin D deficiency, unspecified: Secondary | ICD-10-CM | POA: Diagnosis not present

## 2024-02-08 DIAGNOSIS — Z299 Encounter for prophylactic measures, unspecified: Secondary | ICD-10-CM | POA: Diagnosis not present

## 2024-02-08 DIAGNOSIS — Z7189 Other specified counseling: Secondary | ICD-10-CM | POA: Diagnosis not present

## 2024-02-08 DIAGNOSIS — Z79899 Other long term (current) drug therapy: Secondary | ICD-10-CM | POA: Diagnosis not present

## 2024-02-08 DIAGNOSIS — F419 Anxiety disorder, unspecified: Secondary | ICD-10-CM | POA: Diagnosis not present

## 2024-02-08 DIAGNOSIS — Z1339 Encounter for screening examination for other mental health and behavioral disorders: Secondary | ICD-10-CM | POA: Diagnosis not present

## 2024-02-08 DIAGNOSIS — Z0000 Encounter for general adult medical examination without abnormal findings: Secondary | ICD-10-CM | POA: Diagnosis not present

## 2024-02-08 DIAGNOSIS — Z1331 Encounter for screening for depression: Secondary | ICD-10-CM | POA: Diagnosis not present
